# Patient Record
Sex: Female | Born: 1970
Health system: Southern US, Community
[De-identification: ages and names within clinical notes are randomized; demographics above are authoritative.]

## PROBLEM LIST (undated history)

## (undated) DIAGNOSIS — J45909 Unspecified asthma, uncomplicated: Secondary | ICD-10-CM

## (undated) DIAGNOSIS — I10 Essential (primary) hypertension: Secondary | ICD-10-CM

## (undated) DIAGNOSIS — E282 Polycystic ovarian syndrome: Secondary | ICD-10-CM

## (undated) HISTORY — PX: COSMETIC SURGERY: SHX468

## (undated) HISTORY — DX: Polycystic ovarian syndrome: E28.2

## (undated) HISTORY — DX: Unspecified asthma, uncomplicated: J45.909

## (undated) HISTORY — PX: CYSTECTOMY: SUR359

---

## 2000-04-29 ENCOUNTER — Emergency Department (HOSPITAL_COMMUNITY): Admission: EM | Admit: 2000-04-29 | Discharge: 2000-04-29 | Payer: Self-pay | Admitting: Emergency Medicine

## 2002-03-08 ENCOUNTER — Other Ambulatory Visit: Admission: RE | Admit: 2002-03-08 | Discharge: 2002-03-08 | Payer: Self-pay | Admitting: *Deleted

## 2003-07-13 ENCOUNTER — Other Ambulatory Visit: Admission: RE | Admit: 2003-07-13 | Discharge: 2003-07-13 | Payer: Self-pay | Admitting: Obstetrics and Gynecology

## 2003-08-11 ENCOUNTER — Encounter: Admission: RE | Admit: 2003-08-11 | Discharge: 2003-11-09 | Payer: Self-pay | Admitting: Obstetrics and Gynecology

## 2003-12-04 ENCOUNTER — Inpatient Hospital Stay (HOSPITAL_COMMUNITY): Admission: AD | Admit: 2003-12-04 | Discharge: 2003-12-05 | Payer: Self-pay | Admitting: *Deleted

## 2003-12-12 ENCOUNTER — Encounter: Admission: RE | Admit: 2003-12-12 | Discharge: 2003-12-12 | Payer: Self-pay | Admitting: Obstetrics and Gynecology

## 2004-01-10 ENCOUNTER — Inpatient Hospital Stay (HOSPITAL_COMMUNITY): Admission: AD | Admit: 2004-01-10 | Discharge: 2004-01-10 | Payer: Self-pay | Admitting: Obstetrics and Gynecology

## 2004-01-11 ENCOUNTER — Inpatient Hospital Stay (HOSPITAL_COMMUNITY): Admission: AD | Admit: 2004-01-11 | Discharge: 2004-01-11 | Payer: Self-pay | Admitting: Obstetrics and Gynecology

## 2004-01-16 ENCOUNTER — Encounter (INDEPENDENT_AMBULATORY_CARE_PROVIDER_SITE_OTHER): Payer: Self-pay | Admitting: Specialist

## 2004-01-16 ENCOUNTER — Ambulatory Visit (HOSPITAL_BASED_OUTPATIENT_CLINIC_OR_DEPARTMENT_OTHER): Admission: RE | Admit: 2004-01-16 | Discharge: 2004-01-16 | Payer: Self-pay | Admitting: Plastic Surgery

## 2004-01-16 ENCOUNTER — Ambulatory Visit (HOSPITAL_COMMUNITY): Admission: RE | Admit: 2004-01-16 | Discharge: 2004-01-16 | Payer: Self-pay | Admitting: Plastic Surgery

## 2004-01-25 ENCOUNTER — Inpatient Hospital Stay (HOSPITAL_COMMUNITY): Admission: RE | Admit: 2004-01-25 | Discharge: 2004-01-28 | Payer: Self-pay | Admitting: Obstetrics and Gynecology

## 2004-01-25 ENCOUNTER — Encounter (INDEPENDENT_AMBULATORY_CARE_PROVIDER_SITE_OTHER): Payer: Self-pay | Admitting: *Deleted

## 2004-01-29 ENCOUNTER — Encounter: Admission: RE | Admit: 2004-01-29 | Discharge: 2004-02-28 | Payer: Self-pay | Admitting: Obstetrics and Gynecology

## 2004-02-29 ENCOUNTER — Encounter: Admission: RE | Admit: 2004-02-29 | Discharge: 2004-03-30 | Payer: Self-pay | Admitting: Obstetrics and Gynecology

## 2004-04-30 ENCOUNTER — Encounter: Admission: RE | Admit: 2004-04-30 | Discharge: 2004-05-30 | Payer: Self-pay | Admitting: Obstetrics and Gynecology

## 2006-01-20 ENCOUNTER — Ambulatory Visit: Payer: Self-pay | Admitting: Family Medicine

## 2006-01-27 ENCOUNTER — Ambulatory Visit: Payer: Self-pay | Admitting: Family Medicine

## 2006-04-14 ENCOUNTER — Ambulatory Visit: Payer: Self-pay | Admitting: Family Medicine

## 2006-04-14 LAB — CONVERTED CEMR LAB
ALT: 13 units/L (ref 0–40)
AST: 15 units/L (ref 0–37)
Albumin: 3.6 g/dL (ref 3.5–5.2)
Alkaline Phosphatase: 70 units/L (ref 39–117)
BUN: 10 mg/dL (ref 6–23)
Basophils Absolute: 0 10*3/uL (ref 0.0–0.1)
Basophils Relative: 0.4 % (ref 0.0–1.0)
CO2: 28 meq/L (ref 19–32)
Calcium: 8.7 mg/dL (ref 8.4–10.5)
Chloride: 105 meq/L (ref 96–112)
Chol/HDL Ratio, serum: 5.9
Cholesterol: 191 mg/dL (ref 0–200)
Creatinine, Ser: 0.9 mg/dL (ref 0.4–1.2)
Eosinophil percent: 4.6 % (ref 0.0–5.0)
GFR calc non Af Amer: 75 mL/min
Glomerular Filtration Rate, Af Am: 91 mL/min/{1.73_m2}
Glucose, Bld: 89 mg/dL (ref 70–99)
HCT: 40.2 % (ref 36.0–46.0)
HDL: 32.4 mg/dL — ABNORMAL LOW (ref >39.0)
Hemoglobin: 13.7 g/dL (ref 12.0–15.0)
LDL Cholesterol: 124 mg/dL — ABNORMAL HIGH (ref 0–99)
Lymphocytes Relative: 22.4 % (ref 12.0–46.0)
MCHC: 34.1 g/dL (ref 30.0–36.0)
MCV: 89 fL (ref 78.0–100.0)
Monocytes Absolute: 0.4 10*3/uL (ref 0.2–0.7)
Monocytes Relative: 4.8 % (ref 3.0–11.0)
Neutro Abs: 5.3 10*3/uL (ref 1.4–7.7)
Neutrophils Relative %: 67.8 % (ref 43.0–77.0)
Platelets: 289 10*3/uL (ref 150–400)
Potassium: 4.2 meq/L (ref 3.5–5.1)
RBC: 4.52 M/uL (ref 3.87–5.11)
RDW: 12.2 % (ref 11.5–14.6)
Sodium: 140 meq/L (ref 135–145)
TSH: 1.42 microintl units/mL (ref 0.35–5.50)
Total Bilirubin: 0.8 mg/dL (ref 0.3–1.2)
Total Protein: 7.1 g/dL (ref 6.0–8.3)
Triglyceride fasting, serum: 174 mg/dL — ABNORMAL HIGH (ref 0–149)
VLDL: 35 mg/dL (ref 0–40)
WBC: 7.8 10*3/uL (ref 4.5–10.5)

## 2007-04-06 ENCOUNTER — Encounter: Payer: Self-pay | Admitting: Family Medicine

## 2007-06-03 ENCOUNTER — Telehealth (INDEPENDENT_AMBULATORY_CARE_PROVIDER_SITE_OTHER): Payer: Self-pay | Admitting: *Deleted

## 2007-06-04 ENCOUNTER — Ambulatory Visit: Payer: Self-pay | Admitting: Family Medicine

## 2007-06-04 LAB — CONVERTED CEMR LAB: Rapid Strep: POSITIVE

## 2007-09-22 ENCOUNTER — Encounter: Payer: Self-pay | Admitting: Family Medicine

## 2008-01-21 ENCOUNTER — Ambulatory Visit: Payer: Self-pay | Admitting: Internal Medicine

## 2008-02-04 ENCOUNTER — Ambulatory Visit: Payer: Self-pay | Admitting: Family Medicine

## 2008-05-04 ENCOUNTER — Ambulatory Visit: Payer: Self-pay | Admitting: Family Medicine

## 2008-05-04 DIAGNOSIS — J209 Acute bronchitis, unspecified: Secondary | ICD-10-CM | POA: Insufficient documentation

## 2008-05-04 DIAGNOSIS — R059 Cough, unspecified: Secondary | ICD-10-CM | POA: Insufficient documentation

## 2008-05-04 DIAGNOSIS — R05 Cough: Secondary | ICD-10-CM

## 2008-05-06 ENCOUNTER — Emergency Department (HOSPITAL_COMMUNITY): Admission: EM | Admit: 2008-05-06 | Discharge: 2008-05-06 | Payer: Self-pay | Admitting: Emergency Medicine

## 2008-12-28 ENCOUNTER — Ambulatory Visit: Payer: Self-pay | Admitting: Family Medicine

## 2008-12-28 DIAGNOSIS — G4700 Insomnia, unspecified: Secondary | ICD-10-CM | POA: Insufficient documentation

## 2008-12-28 DIAGNOSIS — E282 Polycystic ovarian syndrome: Secondary | ICD-10-CM | POA: Insufficient documentation

## 2008-12-29 ENCOUNTER — Encounter (INDEPENDENT_AMBULATORY_CARE_PROVIDER_SITE_OTHER): Payer: Self-pay | Admitting: *Deleted

## 2009-02-07 ENCOUNTER — Ambulatory Visit: Payer: Self-pay | Admitting: Family Medicine

## 2009-02-07 ENCOUNTER — Telehealth (INDEPENDENT_AMBULATORY_CARE_PROVIDER_SITE_OTHER): Payer: Self-pay | Admitting: *Deleted

## 2009-02-07 DIAGNOSIS — K5289 Other specified noninfective gastroenteritis and colitis: Secondary | ICD-10-CM | POA: Insufficient documentation

## 2009-02-07 LAB — CONVERTED CEMR LAB
BUN: 16 mg/dL (ref 6–23)
Basophils Absolute: 0 10*3/uL (ref 0.0–0.1)
Basophils Relative: 0 % (ref 0.0–3.0)
Bilirubin Urine: NEGATIVE
Blood in Urine, dipstick: NEGATIVE
CO2: 30 meq/L (ref 19–32)
Calcium: 9.2 mg/dL (ref 8.4–10.5)
Chloride: 106 meq/L (ref 96–112)
Creatinine, Ser: 0.9 mg/dL (ref 0.4–1.2)
Eosinophils Absolute: 0.1 10*3/uL (ref 0.0–0.7)
Eosinophils Relative: 1 % (ref 0.0–5.0)
GFR calc non Af Amer: 73.69 mL/min (ref >60)
Glucose, Bld: 82 mg/dL (ref 70–99)
Glucose, Urine, Semiquant: NEGATIVE
HCT: 40 % (ref 36.0–46.0)
Hemoglobin: 13.8 g/dL (ref 12.0–15.0)
Ketones, urine, test strip: NEGATIVE
Lymphocytes Relative: 33 % (ref 12.0–46.0)
Lymphs Abs: 2.4 10*3/uL (ref 0.7–4.0)
MCHC: 34.5 g/dL (ref 30.0–36.0)
MCV: 91.7 fL (ref 78.0–100.0)
Monocytes Absolute: 0.4 10*3/uL (ref 0.1–1.0)
Monocytes Relative: 5.2 % (ref 3.0–12.0)
Neutro Abs: 4.3 10*3/uL (ref 1.4–7.7)
Neutrophils Relative %: 60.8 % (ref 43.0–77.0)
Nitrite: NEGATIVE
Platelets: 221 10*3/uL (ref 150.0–400.0)
Potassium: 4 meq/L (ref 3.5–5.1)
Protein, U semiquant: NEGATIVE
RBC: 4.36 M/uL (ref 3.87–5.11)
RDW: 11.7 % (ref 11.5–14.6)
Sodium: 140 meq/L (ref 135–145)
Specific Gravity, Urine: >=1.03
Urobilinogen, UA: 0.2
WBC Urine, dipstick: NEGATIVE
WBC: 7.2 10*3/uL (ref 4.5–10.5)
pH: 5

## 2009-07-24 ENCOUNTER — Ambulatory Visit: Payer: Self-pay | Admitting: Family Medicine

## 2009-07-24 ENCOUNTER — Telehealth (INDEPENDENT_AMBULATORY_CARE_PROVIDER_SITE_OTHER): Payer: Self-pay | Admitting: *Deleted

## 2009-07-24 DIAGNOSIS — J019 Acute sinusitis, unspecified: Secondary | ICD-10-CM | POA: Insufficient documentation

## 2010-02-05 ENCOUNTER — Encounter: Admission: RE | Admit: 2010-02-05 | Discharge: 2010-02-05 | Payer: Self-pay | Admitting: Obstetrics and Gynecology

## 2010-02-19 ENCOUNTER — Ambulatory Visit: Payer: Self-pay | Admitting: Family Medicine

## 2010-02-19 DIAGNOSIS — H6690 Otitis media, unspecified, unspecified ear: Secondary | ICD-10-CM | POA: Insufficient documentation

## 2010-02-19 DIAGNOSIS — J309 Allergic rhinitis, unspecified: Secondary | ICD-10-CM | POA: Insufficient documentation

## 2010-02-20 ENCOUNTER — Telehealth (INDEPENDENT_AMBULATORY_CARE_PROVIDER_SITE_OTHER): Payer: Self-pay | Admitting: *Deleted

## 2010-02-27 ENCOUNTER — Ambulatory Visit: Payer: Self-pay | Admitting: Family Medicine

## 2010-02-28 ENCOUNTER — Encounter: Payer: Self-pay | Admitting: Family Medicine

## 2010-07-14 LAB — CONVERTED CEMR LAB
ALT: 12 units/L (ref 0–35)
AST: 14 units/L (ref 0–37)
Albumin: 3.8 g/dL (ref 3.5–5.2)
Alkaline Phosphatase: 47 units/L (ref 39–117)
BUN: 13 mg/dL (ref 6–23)
Basophils Absolute: 0 10*3/uL (ref 0.0–0.1)
Basophils Relative: 0.1 % (ref 0.0–3.0)
Bilirubin, Direct: 0.1 mg/dL (ref 0.0–0.3)
CO2: 29 meq/L (ref 19–32)
Calcium: 9.1 mg/dL (ref 8.4–10.5)
Chloride: 108 meq/L (ref 96–112)
Cholesterol: 169 mg/dL (ref 0–200)
Creatinine, Ser: 0.9 mg/dL (ref 0.4–1.2)
Eosinophils Absolute: 0.1 10*3/uL (ref 0.0–0.7)
Eosinophils Relative: 0.9 % (ref 0.0–5.0)
GFR calc non Af Amer: 73.73 mL/min (ref >60)
Glucose, Bld: 101 mg/dL — ABNORMAL HIGH (ref 70–99)
HCT: 41.2 % (ref 36.0–46.0)
HDL: 36.9 mg/dL — ABNORMAL LOW (ref >39.00)
Hemoglobin: 13.9 g/dL (ref 12.0–15.0)
LDL Cholesterol: 111 mg/dL — ABNORMAL HIGH (ref 0–99)
Lymphocytes Relative: 12.6 % (ref 12.0–46.0)
Lymphs Abs: 1.3 10*3/uL (ref 0.7–4.0)
MCHC: 33.8 g/dL (ref 30.0–36.0)
MCV: 91.6 fL (ref 78.0–100.0)
Monocytes Absolute: 0.5 10*3/uL (ref 0.1–1.0)
Monocytes Relative: 4.4 % (ref 3.0–12.0)
Neutro Abs: 8.5 10*3/uL — ABNORMAL HIGH (ref 1.4–7.7)
Neutrophils Relative %: 82 % — ABNORMAL HIGH (ref 43.0–77.0)
Platelets: 244 10*3/uL (ref 150.0–400.0)
Potassium: 4.6 meq/L (ref 3.5–5.1)
RBC: 4.5 M/uL (ref 3.87–5.11)
RDW: 12.4 % (ref 11.5–14.6)
Sodium: 142 meq/L (ref 135–145)
TSH: 1.17 microintl units/mL (ref 0.35–5.50)
Total Bilirubin: 0.9 mg/dL (ref 0.3–1.2)
Total CHOL/HDL Ratio: 5
Total Protein: 7.3 g/dL (ref 6.0–8.3)
Triglycerides: 104 mg/dL (ref 0.0–149.0)
VLDL: 20.8 mg/dL (ref 0.0–40.0)
WBC: 10.4 10*3/uL (ref 4.5–10.5)

## 2010-07-16 NOTE — Letter (Signed)
Summary: Results Follow up Letter  [facility]  [address] [place], [place] [address]   Phone: [phone]  Fax: [phone]    12/29/2008 MRN: [id number]  [facility] [address] [place], [place]  [address]  Dear Ms. MERTEL,  The following are the results of your recent test(s):  Test         Result    Pap Smear:        Normal _____  Not Normal _____ Comments: ______________________________________________________ Cholesterol: LDL(Bad cholesterol):         Your goal is less than:         HDL (Good cholesterol):       Your goal is more than: Comments:  ______________________________________________________ Mammogram:        Normal _____  Not Normal _____ Comments:  ___________________________________________________________________ Hemoccult:        Normal _____  Not normal _______ Comments:    _____________________________________________________________________ Other Tests: PLEASE SEE COPY OF LABS FROM 12/28/08 AND COMMENTS    We routinely do not discuss normal results over the telephone.  If you desire a copy of the results, or you have any questions about this information we can discuss them at your next office visit.   Sincerely,

## 2010-07-16 NOTE — Letter (Signed)
Summary: endocrine consultation  endocrine consultation   Imported By: Freddy Jaksch 06/22/2007 15:10:48  _____________________________________________________________________  External Attachment:    Type:   Image     Comment:   External Document

## 2010-07-16 NOTE — Progress Notes (Signed)
Summary: discuss labs  Phone Note Outgoing Call   Call placed by: Doristine Devoid,  February 07, 2009 4:06 PM Call placed to: Patient Summary of Call: normal WBC indicates no active bacterial infxn- no need for abx at this time.  sxs are likely related to viral illness.  electrolytes indicate no sign of dehydration.  all labs normal.  Follow-up for Phone Call        spoke with patient aware of labs ..................Marland KitchenDoristine Devoid  February 07, 2009 4:07 PM

## 2010-07-16 NOTE — Assessment & Plan Note (Signed)
Summary: upper resp. infection--acute only--tl   Vital Signs:  Patient Profile:   40 Years Old Female Weight:      195.4 pounds Temp:     98.2 degrees F oral Pulse rate:   88 / minute Resp:     14 per minute BP sitting:   142 / 98  (left arm) Cuff size:   large  Vitals Entered By: Shonna Chock (January 21, 2008 1:39 PM)                 Chief Complaint:  URI-COUGH(PRODUCTIVE @ TIMES). STARTED WITH COUGH, CONGESTION AND FE VER 2 WEEKS AGO, and Cough.  History of Present Illness: See BP; she denies HTN  . No Decongestants for RTI X 1 week  Cough; Rx: Advil Cold & Sinus      This is a 40 year old woman who presents with Cough.  The patient reports productive cough and wheezing, but denies non-productive cough, pleuritic chest pain, shortness of breath, exertional dyspnea, fever, hemoptysis, and malaise.  Associated symtpoms include nasal congestion and acid reflux symptoms.  The patient denies the following symptoms: cold/URI symptoms and sore throat.  The cough is worse with lying down.  Ineffective prior treatments have included OTC cough medication.  Risk factors include history of asthma and history of reflux.      Current Allergies (reviewed today): No known allergies     Risk Factors:  Tobacco use:  quit    Year quit:  AGE 40 Alcohol use:  yes    Type:  WINE 1-2 X MONTHLY Exercise:  no   Review of Systems  Eyes      Denies discharge, eye pain, and red eye.  ENT      No facial pain or purulence   Physical Exam  General:     well-nourished,in no acute distress; alert,appropriate and cooperative throughout examination Ears:     External ear exam shows no significant lesions or deformities.  Otoscopic examination reveals clear canals, tympanic membranes are intact bilaterally without bulging, retraction, inflammation or discharge. Hearing is grossly normal bilaterally. Nose:     External nasal examination shows no deformity or inflammation. Nasal mucosa are  pink and moist without lesions or exudates. Mouth:     Oral mucosa and oropharynx without lesions or exudates.  Teeth in good repair. Lungs:     Normal respiratory effort, chest expands symmetrically. Lungs: rare isolated  wheezes. Cervical Nodes:     No lymphadenopathy noted Axillary Nodes:     No palpable lymphadenopathy    Impression & Recommendations:  Problem # 1:  BRONCHITIS-ACUTE (ICD-466.0)  The following medications were removed from the medication list:    Penicillin V Potassium 500 Mg Tabs (Penicillin v potassium) .Marland Kitchen... 1 by mouth bid  Her updated medication list for this problem includes:    Azithromycin 250 Mg Tabs (Azithromycin) .Marland Kitchen... As per pack    Promethazine-codeine 6.25-10 Mg/5ml Syrp (Promethazine-codeine) .Marland Kitchen... 1 tsp q 6 hrs prn    Ventolin Hfa 108 (90 Base) Mcg/act Aers (Albuterol sulfate) .Marland Kitchen... 1-2 puffs q 4-6 hrs prn   Complete Medication List: 1)  Metformin Hcl 500 Mg Tabs (Metformin hcl) .Marland Kitchen.. 1 by mouth two times a day (morning) 2)  Azithromycin 250 Mg Tabs (Azithromycin) .... As per pack 3)  Promethazine-codeine 6.25-10 Mg/7ml Syrp (Promethazine-codeine) .Marland Kitchen.. 1 tsp q 6 hrs prn 4)  Ventolin Hfa 108 (90 Base) Mcg/act Aers (Albuterol sulfate) .Marland Kitchen.. 1-2 puffs q 4-6 hrs prn   Patient  Instructions: 1)  Drink as much fluid as you can tolerate for the next few days.Neti pot once daily as needed. Consider [employer] low carb diet.   Prescriptions: VENTOLIN HFA 108 (90 BASE) MCG/ACT  AERS (ALBUTEROL SULFATE) 1-2 puffs q 4-6 hrs prn  #1 x 2   Entered and Authorized by:   Marga Melnick MD   Signed by:   Marga Melnick MD on 01/21/2008   Method used:   Print then Give to Patient   RxID:   [id number] PROMETHAZINE-CODEINE 6.25-10 MG/5ML  SYRP (PROMETHAZINE-CODEINE) 1 tsp q 6 hrs prn  #120cc x 0   Entered and Authorized by:   Marga Melnick MD   Signed by:   Marga Melnick MD on 01/21/2008   Method used:   Electronically sent to ...        [facility] #[id number]*       [address]       [place], [place]  [address]       Ph: [phone]       Fax: [phone]   RxID:   [id number] AZITHROMYCIN 250 MG  TABS (AZITHROMYCIN) as per pack  #1 pack x 0   Entered and Authorized by:   Marga Melnick MD   Signed by:   Marga Melnick MD on 01/21/2008   Method used:   Electronically sent to ...       [facility] #[id number]*       [address]       [place], [place]  [address]       Ph: [phone]       Fax: [phone]   RxID:   [id number]  ]

## 2010-07-16 NOTE — Progress Notes (Signed)
  Phone Note From Pharmacy   Caller: [facility] #[id number]* Summary of Call: Augmentin not covered by insurance per dr Laury Axon change to Ceftin 500 mg 1 by mouth two times a day for 10 days.     New/Updated Medications: CEFTIN 500 MG TABS (CEFUROXIME AXETIL) 1 by mouth two times a day for 10 days. Prescriptions: CEFTIN 500 MG TABS (CEFUROXIME AXETIL) 1 by mouth two times a day for 10 days.  #20 x 0   Entered by:   Army Fossa CMA   Authorized by:   Loreen Freud DO   Signed by:   Army Fossa CMA on 07/24/2009   Method used:   Telephoned to ...       [facility] #[id number]* (retail)       [address]       [place], [place]  [address]       Ph: [phone]       Fax: [phone]   RxID:   [id number]

## 2010-07-16 NOTE — Assessment & Plan Note (Signed)
Summary: swine flu--acute only--pt already had h1n1 shot--tl   Vital Signs:  Patient Profile:   40 Years Old Female Weight:      199.13 pounds Temp:     98.1 degrees F oral BP sitting:   130 / 70  Vitals Entered By: Kandice Hams (May 04, 2008 1:09 PM)                 PCP:  Laury Axon  Chief Complaint:  pt said had flu sx fever, cough, diarrhea, vomiting, all sx gone, now c/o hard to take a deep breath in Note* pt says she has a symbicort but never took, afraid to use, and Cough.  History of Present Illness:  Cough      This is a 40 year old woman who presents with Cough.  The symptoms began duration > 3 days ago.  Pt here c/o cough and congestion since friday.  Pt had fever 102 Sat and Sun and N/VD on Saturday- Tuesday and now pt still congested but other symptoms are gone.  The patient reports productive cough, shortness of breath, and fever.  The patient denies the following symptoms: cold/URI symptoms, sore throat, nasal congestion, chronic rhinitis, weight loss, acid reflux symptoms, and peripheral edema.      Current Allergies: No known allergies      Review of Systems      See HPI   Physical Exam  General:     Well-developed,well-nourished,in no acute distress; alert,appropriate and cooperative throughout examination Ears:     External ear exam shows no significant lesions or deformities.  Otoscopic examination reveals clear canals, tympanic membranes are intact bilaterally without bulging, retraction, inflammation or discharge. Hearing is grossly normal bilaterally. Nose:     External nasal examination shows no deformity or inflammation. Nasal mucosa are pink and moist without lesions or exudates. Mouth:     Oral mucosa and oropharynx without lesions or exudates.  Teeth in good repair. Neck:     No deformities, masses, or tenderness noted. Lungs:     R decreased breath sounds and L decreased breath sounds.  improved after neb tx. Heart:     normal rate,  regular rhythm, and no murmur.   Cervical Nodes:     No lymphadenopathy noted Psych:     Cognition and judgment appear intact. Alert and cooperative with normal attention span and concentration. No apparent delusions, illusions, hallucinations    Impression & Recommendations:  Problem # 1:  BRONCHITIS, ACUTE WITH BRONCHOSPASM (ICD-466.0) Assessment: Improved resolving--call or rto if symptoms worsen The following medications were removed from the medication list:    Promethazine-codeine 6.25-10 Mg/75ml Syrp (Promethazine-codeine) .Marland Kitchen... 1 tsp q 6 hrs prn    Ventolin Hfa 108 (90 Base) Mcg/act Aers (Albuterol sulfate) .Marland Kitchen... 1-2 puffs q 4-6 hrs prn  Her updated medication list for this problem includes:    Proventil Hfa 108 (90 Base) Mcg/act Aers (Albuterol sulfate) .Marland Kitchen... 2 puffs qid as needed    Symbicort 160-4.5 Mcg/act Aero (Budesonide-formoterol fumarate) .Marland Kitchen... 2 puffs two times a day  Orders: Nebulizer Tx (53976)   Complete Medication List: 1)  Metformin Hcl 500 Mg Tabs (Metformin hcl) .Marland Kitchen.. 1 by mouth two times a day (morning) 2)  Proventil Hfa 108 (90 Base) Mcg/act Aers (Albuterol sulfate) .... 2 puffs qid as needed 3)  Symbicort 160-4.5 Mcg/act Aero (Budesonide-formoterol fumarate) .... 2 puffs two times a day    Prescriptions: SYMBICORT 160-4.5 MCG/ACT AERO (BUDESONIDE-FORMOTEROL FUMARATE) 2 puffs two times a day  #1  x 0   Entered and Authorized by:   Loreen Freud DO   Signed by:   Loreen Freud DO on 05/04/2008   Method used:   Historical   RxID:   [id number] PROVENTIL HFA 108 (90 BASE) MCG/ACT AERS (ALBUTEROL SULFATE) 2 puffs qid as needed  #1 x 1   Entered and Authorized by:   Loreen Freud DO   Signed by:   Loreen Freud DO on 05/04/2008   Method used:   Electronically to        [facility] #[id number]* (retail)       [address]       [place], [place]  [address]       Ph: [phone]       Fax: [phone]   RxID:    [id number]  ]

## 2010-07-16 NOTE — Letter (Signed)
Summary: Endocrine C  Endocrine C   Imported By: Freddy Jaksch 10/27/2007 11:56:46  _____________________________________________________________________  External Attachment:    Type:   Image     Comment:   External Document

## 2010-07-16 NOTE — Progress Notes (Signed)
Summary: ear pain worse  Phone Note Call from Patient Call back at Work Phone [phone] Call back at [phone]   Caller: Patient Summary of Call: patient was seen yesterday [phone] for ear pain - she said it has gotten worse - Initial call taken by: Okey Regal [place],  February 20, 2010 1:50 PM  Follow-up for Phone Call        spoke w/ patient informed to give medication more time to work and continue recommendations........Marland KitchenDoristine Devoid CMA  February 20, 2010 2:03 PM

## 2010-07-16 NOTE — Assessment & Plan Note (Signed)
Summary: NAUSEA,VOMITING,DIARRHEA,LOWER BACK PAIN x1WEEK/RH.....   Vital Signs:  Patient profile:   40 year old female Weight:      150 pounds Temp:     98.1 degrees F oral BP sitting:   114 / 76  (left arm)  Vitals Entered By: Doristine Devoid (February 07, 2009 1:05 PM) CC: vomiting x1 some diarrhea and lower back pain    History of Present Illness: 40 yo woman here today vomiting, diarrhea.  sxs started 8 days ago.  first few days were 'vomiting and exhaustion' but did vomit again yesterday.  2-3 days ago started w/ low back pain, now radiating to the flanks.  pain is bilateral, L>R, no blood in urine, no frequency, urgency, hesitancy.  diarrhea started 3 days ago- watery, green.  no vomiting today, still w/ nausea.  1 watery stool today.  no fevers.  + sick contact.  recent travels.  reports improvement in sxs since last week but not resolved.  recent international travel.  Allergies (verified): No Known Drug Allergies  Review of Systems      See HPI  Physical Exam  General:  Well-developed,well-nourished,in no acute distress; alert,appropriate and cooperative throughout examination Lungs:  Normal respiratory effort, chest expands symmetrically. Lungs are clear to auscultation, no crackles or wheezes. Heart:  normal rate, regular rhythm, and no murmur.   Abdomen:  soft, diffuse discomfort w/ palpation, + BS.  no rebound, guarding.  no CVA tenderness.   Impression & Recommendations:  Problem # 1:  GASTROENTERITIS (ICD-558.9) Assessment New pt's sxs consistent w/ gastroenteritis but given recent international travel and egg recall will check stool cx for infxn.  will check CBC to determine viral vs bacterial infxn.  if bacterial infxn will presume diverticulitis and start Cipro and Flagyl.  pt would prefer to avoid CT scan at this time.  given improvement in sxs virus is likely b/c diverticulitis would worsen w/out tx.  will follow closely. Orders: Venipuncture ([address]) TLB-CBC  Platelet - w/Differential (85025-CBCD) TLB-BMP (Basic Metabolic Panel-BMET) (80048-METABOL) T-Culture, Stool ([id number]) UA Dipstick w/o Micro (manual) (60454)  Complete Medication List: 1)  Glumetza 1000 Mg Xr24h-tab (Metformin hcl) .... Take one tablet two times a day 2)  Zolpidem Tartrate 5 Mg Tabs (Zolpidem tartrate) .Marland Kitchen.. 1-2 by mouth at night as needed 3)  Promethazine Hcl 25 Mg Tabs (Promethazine hcl) .Marland Kitchen.. 1 tab by mouth q6 as needed for nausea  Patient Instructions: 1)  We'll notify you of your lab results 2)  Try and drink plenty of fluids to avoid dehydration 3)  Take the Promethazine as needed for nausea 4)  Use Immodium for the diarrhea- after collecting your stool sample 5)  Call with any questions or concerns 6)  If your pain worsens, you are unable to hold anything down, or develop high fevers- please call or go to the ER 7)  HANG IN THERE!! Prescriptions: PROMETHAZINE HCL 25 MG  TABS (PROMETHAZINE HCL) 1 tab by mouth Q6 as needed for nausea  #20 x 0   Entered and Authorized by:   Neena Rhymes MD   Signed by:   Neena Rhymes MD on 02/07/2009   Method used:   Electronically to        [facility] #[id number]* (retail)       [address]       [place], [place]  [address]       Ph: [phone]  Fax: [phone]   RxID:   [id number]   Laboratory Results   Urine Tests    Routine Urinalysis   Glucose: negative   (Normal Range: Negative) Bilirubin: negative   (Normal Range: Negative) Ketone: negative   (Normal Range: Negative) Spec. Gravity: >=1.030   (Normal Range: 1.003-1.035) Blood: negative   (Normal Range: Negative) pH: 5.0   (Normal Range: 5.0-8.0) Protein: negative   (Normal Range: Negative) Urobilinogen: 0.2   (Normal Range: 0-1) Nitrite: negative   (Normal Range: Negative) Leukocyte Esterace: negative   (Normal Range: Negative)

## 2010-07-16 NOTE — Consult Note (Signed)
Summary: [facility]  [facility]   Imported By: Lanelle Bal 03/08/2010 13:50:22  _____________________________________________________________________  External Attachment:    Type:   Image     Comment:   External Document

## 2010-07-16 NOTE — Assessment & Plan Note (Signed)
Summary: acute only/sore throat strep?/alr   Vital Signs:  Patient Profile:   40 Years Old Female Weight:      197.50 pounds Temp:     97.8 degrees F oral Pulse rate:   74 / minute Resp:     16 per minute BP sitting:   130 / 90  (right arm)  Pt. in pain?   no  Vitals Entered By: Ardyth Man (June 04, 2007 11:13 AM)                  Chief Complaint:  ?Strep throat and URI symptoms.  History of Present Illness:  URI Symptoms      This is a 40 year old woman who presents with URI symptoms.  The patient reports sore throat, but denies nasal congestion, clear nasal discharge, purulent nasal discharge, dry cough, productive cough, earache, and sick contacts.  Associated symptoms include fever.  The patient denies stiff neck, dyspnea, wheezing, rash, vomiting, diarrhea, and use of an antipyretic.  Symptoms started Wednesday. Son diagnosed with strep.        Physical Exam  General:     Well-developed,well-nourished,in no acute distress; alert,appropriate and cooperative throughout examination Ears:     External ear exam shows no significant lesions or deformities.  Otoscopic examination reveals clear canals, tympanic membranes are intact bilaterally without bulging, retraction, inflammation or discharge. Hearing is grossly normal bilaterally. Nose:     External nasal examination shows no deformity or inflammation. Nasal mucosa are pink and moist without lesions or exudates. Mouth:     pharyngeal erythema.   Neck:     No deformities, masses, or tenderness noted. Lungs:     Normal respiratory effort, chest expands symmetrically. Lungs are clear to auscultation, no crackles or wheezes. Heart:     Normal rate and regular rhythm. S1 and S2 normal without gallop, murmur, click, rub or other extra sounds. Cervical Nodes:     tender anterior cervical lymph nodes.    Impression & Recommendations:  Problem # 1:  PHARYNGITIS-ACUTE (ICD-462)  Her updated medication  list for this problem includes:    Penicillin V Potassium 500 Mg Tabs (Penicillin v potassium) .Marland Kitchen... 1 by mouth bid Instructed to complete antibiotics and call if not improved or if symptoms worsen, she should f/u Symptomatic care review  Complete Medication List: 1)  Penicillin V Potassium 500 Mg Tabs (Penicillin v potassium) .Marland Kitchen.. 1 by mouth bid     Prescriptions: PENICILLIN V POTASSIUM 500 MG  TABS (PENICILLIN V POTASSIUM) 1 by mouth bid  #20 x 0   Entered and Authorized by:   Leanne Chang MD   Signed by:   Leanne Chang MD on 06/04/2007   Method used:   Electronically sent to ...       [facility] #[id number]*       [address]       [place], [place]  [address]       Ph: [phone]       Fax: [phone]   RxID:   [id number]  ] Laboratory Results  Date/Time Received: ...................................................................Ardyth Man  June 04, 2007 11:20 AM  Date/Time Reported: ...................................................................Ardyth Man  June 04, 2007 11:20 AM   Other Tests  Rapid Strep: positive    Appended Document: acute only/sore throat strep?/alr    Clinical Lists Changes  Observations: Added new observation of NKA: T (06/04/2007 11:48)

## 2010-07-16 NOTE — Assessment & Plan Note (Signed)
Summary: ear not better/cbs   Vital Signs:  Patient profile:   40 year old female Height:      67 inches Weight:      170 pounds Temp:     98.4 degrees F oral Pulse rate:   72 / minute BP sitting:   140 / 90  (left arm)  Vitals Entered By: Jeremy Johann CMA (February 27, 2010 1:51 PM) CC: pain, pressure and ringing in both ears   History of Present Illness: 40 yo woman here today for bilateral ear pain.  pt started on amox on 9/6.  pt still has 2 days remaining of abx.  taking motrin continuously.  using nasal spray, sudafed w/out relief.  still having pain, pressure- 'the ringing is terrible'.    Current Medications (verified): 1)  Glumetza 1000 Mg Xr24h-Tab (Metformin Hcl) .... Take One Tablet Two Times A Day 2)  Zolpidem Tartrate 5 Mg  Tabs (Zolpidem Tartrate) .Marland Kitchen.. 1-2 By Mouth At Night As Needed 3)  Flonase 50 Mcg/act Susp (Fluticasone Propionate) .... 2 Sprays Each Nostril Once Daily 4)  Amoxicillin 500 Mg Tabs (Amoxicillin) .Marland Kitchen.. 1 Tab By Mouth Two Times A Day X10 Days  Allergies (verified): No Known Drug Allergies  Past History:  Past Medical History: PCOS serous otitis  Review of Systems      See HPI  Physical Exam  General:  Well-developed,well-nourished,in no acute distress; alert,appropriate and cooperative throughout examination Head:  Normocephalic and atraumatic without obvious abnormalities. No apparent alopecia or balding.  no TTP over sinuses Eyes:  no injxn or inflammation Ears:  + serous fluid behind TMs bilaterally Nose:  + turbinate edema Mouth:  + PND   Impression & Recommendations:  Problem # 1:  OTITIS MEDIA (ICD-382.9) Assessment Unchanged pt w/ continued serous OM.  given upcoming flight and lack of improvement on Amox, sudafed, nasal steroid will refer to ENT.  Pt expresses understanding and is in agreement w/ this plan. Her updated medication list for this problem includes:    Amoxicillin 500 Mg Tabs (Amoxicillin) .Marland Kitchen... 1 tab by mouth  two times a day x10 days  Orders: ENT Referral (ENT)  Complete Medication List: 1)  Glumetza 1000 Mg Xr24h-tab (Metformin hcl) .... Take one tablet two times a day 2)  Zolpidem Tartrate 5 Mg Tabs (Zolpidem tartrate) .Marland Kitchen.. 1-2 by mouth at night as needed 3)  Flonase 50 Mcg/act Susp (Fluticasone propionate) .... 2 sprays each nostril once daily 4)  Amoxicillin 500 Mg Tabs (Amoxicillin) .Marland Kitchen.. 1 tab by mouth two times a day x10 days  Patient Instructions: 1)  Follow up as needed 2)  Someone will call you with your ENT appt 3)  Continue the nasal spray, sudafed, and finish the amox 4)  HANG IN THERE!!!

## 2010-07-16 NOTE — Assessment & Plan Note (Signed)
Summary: ?cold//vgj   Vital Signs:  Patient Profile:   40 Years Old Female Weight:      198.50 pounds Temp:     98.3 degrees F oral Pulse rate:   82 / minute Resp:     14 per minute BP sitting:   120 / 80  (right arm)  Pt. in pain?   no  Vitals Entered By: Ardyth Man (February 04, 2008 2:04 PM)                  Chief Complaint:  cough, clear sputum, and URI symptoms.  History of Present Illness:  URI Symptoms      This is a 40 year old woman who presents with URI symptoms.  The patient reports nasal congestion and productive cough.  The patient denies fever, low-grade fever (<100.5 degrees), fever of 100.5-103 degrees, fever of 103.1-104 degrees, fever to >104 degrees, stiff neck, dyspnea, wheezing, rash, vomiting, diarrhea, use of an antipyretic, and response to antipyretic.  The patient denies itchy watery eyes, itchy throat, sneezing, seasonal symptoms, response to antihistamine, headache, muscle aches, and severe fatigue.  The patient denies the following risk factors for Strep sinusitis: unilateral facial pain, unilateral nasal discharge, poor response to decongestant, double sickening, tooth pain, Strep exposure, tender adenopathy, and absence of cough.      Current Allergies: No known allergies     Risk Factors: Tobacco use:  quit    Year quit:  AGE 46 Alcohol use:  yes    Type:  WINE 1-2 X MONTHLY Exercise:  no   Review of Systems      See HPI   Physical Exam  General:     Well-developed,well-nourished,in no acute distress; alert,appropriate and cooperative throughout examination Neck:     No deformities, masses, or tenderness noted. Lungs:     Normal respiratory effort, chest expands symmetrically. Lungs are clear to auscultation, no crackles or wheezes. Heart:     normal rate, regular rhythm, and no murmur.   Extremities:     No clubbing, cyanosis, edema, or deformity noted with normal full range of motion of all joints.   Psych:     Oriented  X3, memory intact for recent and remote, and normally interactive.      Impression & Recommendations:  Problem # 1:  BRONCHITIS-ACUTE (ICD-466.0) Rto if no better The following medications were removed from the medication list:    Azithromycin 250 Mg Tabs (Azithromycin) .Marland Kitchen... As per pack  Her updated medication list for this problem includes:    Promethazine-codeine 6.25-10 Mg/61ml Syrp (Promethazine-codeine) .Marland Kitchen... 1 tsp q 6 hrs prn    Ventolin Hfa 108 (90 Base) Mcg/act Aers (Albuterol sulfate) .Marland Kitchen... 1-2 puffs q 4-6 hrs prn    Avelox 400 Mg Tabs (Moxifloxacin hcl) .Marland Kitchen... 1 by mouth once daily for 5 days  Orders: T-2 View CXR (71020TC) Xopenex 1.25mg  (Z6109) Admin of Therapeutic Inj  intramuscular or subcutaneous ([address])  Take antibiotics and other medications as directed. Encouraged to push clear liquids, get enough rest, and take acetaminophen as needed. To be seen in 5-7 days if no improvement, sooner if worse.   Complete Medication List: 1)  Metformin Hcl 500 Mg Tabs (Metformin hcl) .Marland Kitchen.. 1 by mouth two times a day (morning) 2)  Promethazine-codeine 6.25-10 Mg/17ml Syrp (Promethazine-codeine) .Marland Kitchen.. 1 tsp q 6 hrs prn 3)  Ventolin Hfa 108 (90 Base) Mcg/act Aers (Albuterol sulfate) .Marland Kitchen.. 1-2 puffs q 4-6 hrs prn 4)  Prednisone 10 Mg Tabs (Prednisone) .Marland KitchenMarland KitchenMarland Kitchen  3 by mouth once daily for 3 days then 2 by mouth once daily for 3 days then 1 by mouth once daily for 3 days 5)  Avelox 400 Mg Tabs (Moxifloxacin hcl) .Marland Kitchen.. 1 by mouth once daily for 5 days    Prescriptions: AVELOX 400 MG TABS (MOXIFLOXACIN HCL) 1 by mouth once daily for 5 days  #5 x 0   Entered and Authorized by:   Loreen Freud DO   Signed by:   Loreen Freud DO on 02/04/2008   Method used:   Electronically to        [facility] [phone]* (retail)       [address]       [place], [place]  [address]       Ph: [phone]       Fax: [phone]   RxID:   [id number] PREDNISONE  10 MG TABS (PREDNISONE) 3 by mouth once daily for 3 days then 2 by mouth once daily for 3 days then 1 by mouth once daily for 3 days  #18 x 0   Entered and Authorized by:   Loreen Freud DO   Signed by:   Loreen Freud DO on 02/04/2008   Method used:   Electronically to        [facility] [phone]* (retail)       [address]       [place], [place]  [address]       Ph: [phone]       Fax: [phone]   RxID:   [id number]  ]  Medication Administration  Medication # 1:    Medication: Xopenex 1.25mg     Diagnosis: BRONCHITIS-ACUTE (ICD-466.0)    Route: inhaled    Exp Date: 02/14/2008    Lot #: [id number]    Mfr: sepracor    Patient tolerated medication without complications    Given by: Ardyth Man (February 04, 2008 3:03 PM)  Orders Added: 1)  T-2 View CXR [71020TC] 2)  Xopenex 1.25mg  [N2355] 3)  Admin of Therapeutic Inj  intramuscular or subcutaneous [96[address]] 4)  Est. Patient Level IV [73220]

## 2010-07-16 NOTE — Assessment & Plan Note (Signed)
Summary: earache/cbs   Vital Signs:  Patient profile:   40 year old female Height:      67 inches (170.18 cm) Weight:      173 pounds (78.64 kg) BMI:     27.19 Temp:     98.2 degrees F (36.78 degrees C) oral BP sitting:   110 / 76  (left arm) Cuff size:   regular  Vitals Entered By: Lucious Groves CMA (February 19, 2010 11:43 AM) CC: C/O earache x2.5 weeks./kb Is Patient Diabetic? No Pain Assessment Patient in pain? yes     Location: jaw Intensity: 7 Type: aching Onset of pain  2.5 weeks Comments Patient notes that it began on the left side and now is on the right also. Patient notes that it is a great deal of "pressure". Patient denies bleeding from the ear, fever, and HA./kb   History of Present Illness: 40 yo woman here today for L ear pain.  sxs started 2.5 weeks ago and is now spreading to R ear.  has been traveling overseas.  no fever.  'i woke up 1 morning and it felt like it was clogged w/ severe ringing'.  denies sinus congestion, nasal drainage, cough, sore throat.  pain located along L upper jaw- similar to sinus pain but w/out other accompanying sxs.  flies frequently.  Current Medications (verified): 1)  Glumetza 1000 Mg Xr24h-Tab (Metformin Hcl) .... Take One Tablet Two Times A Day 2)  Zolpidem Tartrate 5 Mg  Tabs (Zolpidem Tartrate) .Marland Kitchen.. 1-2 By Mouth At Night As Needed 3)  Amlodipine Besylate 10 Mg Tabs (Amlodipine Besylate) .Marland Kitchen.. 1 By Mouth Daily 4)  Ceftin 500 Mg Tabs (Cefuroxime Axetil) .Marland Kitchen.. 1 By Mouth Two Times A Day For 10 Days. 5)  Cheratussin Ac 100-10 Mg/10ml Syrp (Guaifenesin-Codeine) .Marland Kitchen.. 1-2 Tsp By Mouth At Bedtime As Needed 6)  Flonase 50 Mcg/act Susp (Fluticasone Propionate) .... 2 Sprays Each Nostril Once Daily 7)  Amoxicillin 500 Mg Tabs (Amoxicillin) .Marland Kitchen.. 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 No apparent alopecia or balding.  no TTP over sinuses Eyes:  no injxn or inflammation Ears:  R TM retracted L TM w/ opaque fluid behind it Nose:  + turbinate edema Mouth:  + PND Neck:  No deformities, masses, or tenderness noted. Lungs:  Normal respiratory effort, chest expands symmetrically. Lungs are clear to auscultation, no crackles or wheezes. Heart:  normal rate and no murmur.     Impression & Recommendations:  Problem # 1:  OTITIS MEDIA (ICD-382.9) Assessment New pt w/ opaque fluid behind L TM.  start amox. The following medications were removed from the medication list:    Ceftin 500 Mg Tabs (Cefuroxime axetil) .Marland Kitchen... 1 by mouth two times a day for 10 days. Her updated medication list for this problem includes:    Amoxicillin 500 Mg Tabs (Amoxicillin) .Marland Kitchen... 1 tab by mouth two times a day x10 days  Problem # 2:  ALLERGIC RHINITIS (ICD-477.9) Assessment: New resume Flonase.  sudafed temporarily for sxs relief. Her updated medication list for this problem includes:  Flonase 50 Mcg/act Susp (Fluticasone propionate) .Marland Kitchen... 2 sprays each nostril once daily  Complete Medication List: 1)  Glumetza 1000 Mg Xr24h-tab (Metformin hcl) .... Take one tablet two times a day 2)  Zolpidem Tartrate 5 Mg Tabs (Zolpidem tartrate) .Marland Kitchen.. 1-2 by mouth at night as needed 3)  Amlodipine Besylate 10 Mg Tabs (Amlodipine besylate) .Marland Kitchen.. 1 by mouth daily 4)  Cheratussin Ac 100-10 Mg/62ml Syrp (Guaifenesin-codeine) .Marland Kitchen.. 1-2 tsp by mouth at bedtime as needed 5)  Flonase 50 Mcg/act Susp (Fluticasone propionate) .... 2 sprays each nostril once daily 6)  Amoxicillin 500 Mg Tabs  (Amoxicillin) .Marland Kitchen.. 1 tab by mouth two times a day x10 days  Patient Instructions: 1)  Schedule your complete physical at your convenience- do not eat before this appt 2)  Take the Amoxicillin as directed- take w/ food to avoid upset stomach 3)  Sudafed for the nasal congestion and fluid in the ears 4)  Ibuprofen for pain 5)  Restart the nasal spray for allergy component 6)  Hang in there!!! Prescriptions: FLONASE 50 MCG/ACT SUSP (FLUTICASONE PROPIONATE) 2 sprays each nostril once daily  #1 x 3   Entered and Authorized by:   Neena Rhymes MD   Signed by:   Neena Rhymes MD on 02/19/2010   Method used:   Electronically to        [facility] [phone]* (retail)       [address]       [place], [place]  [address]       Ph: [phone]       Fax: [phone]   RxID:   [id number] AMOXICILLIN 500 MG TABS (AMOXICILLIN) 1 tab by mouth two times a day x10 days  #20 x 0   Entered and Authorized by:   Neena Rhymes MD   Signed by:   Neena Rhymes MD on 02/19/2010   Method used:   Electronically to        [facility] #[id number]* (retail)       [address]       [place], [place]  [address]       Ph: [phone]       Fax: [phone]   RxID:   [id number]

## 2010-07-16 NOTE — Assessment & Plan Note (Signed)
Summary: sinus infection/kdc   Vital Signs:  Patient profile:   40 year old female Height:      67 inches Weight:      160.13 pounds BMI:     25.17 Temp:     98.2 degrees F oral Pulse rate:   72 / minute Pulse rhythm:   regular BP sitting:   122 / 80  (left arm) Cuff size:   large  Vitals Entered By: Army Fossa CMA (July 24, 2009 3:13 PM) CC: Pt c/o chest and nasal congestion, teeth pressure, ear pressure x 4 weeks. , URI symptoms   History of Present Illness:       This is a 40 year old woman who presents with URI symptoms.  The symptoms began 4 weeks ago.  The patient complains of nasal congestion, purulent nasal discharge, and earache, but denies clear nasal discharge, sore throat, dry cough, productive cough, and sick contacts.  The patient denies fever, low-grade fever (<100.5 degrees), fever of 100.5-103 degrees, fever of 103.1-104 degrees, fever to >104 degrees, stiff neck, dyspnea, wheezing, rash, vomiting, diarrhea, use of an antipyretic, and response to antipyretic.  The patient also reports headache.  The patient denies the following risk factors for Strep sinusitis: unilateral facial pain, unilateral nasal discharge, poor response to decongestant, double sickening, tooth pain, Strep exposure, tender adenopathy, and absence of cough.  + otc with no relief --+ b/l sinus pressure.  Allergies (verified): No Known Drug Allergies  Past History:  Past medical, surgical, family and social histories (including risk factors) reviewed for relevance to current acute and chronic problems.  Past Medical History: Reviewed history from 12/28/2008 and no changes required. PCOS  Past Surgical History: Reviewed history from 12/28/2008 and no changes required. Csection R ganglion cyst removed  Family History: Reviewed history from 12/28/2008 and no changes required. Colon Cancer- maternal great grandmother (dx'd 69-60s) Breast Cancer- none CAD- both sides of family DM-  MGM MGF- prostate CA, kidney CA, splenic CA, lung CA (alive at 68)  Social History: Reviewed history from 12/28/2008 and no changes required. 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  Otoscopic examination reveals clear canals, tympanic membranes are intact bilaterally without bulging, retraction, inflammation or discharge. Hearing is grossly normal bilaterally. Nose:  L maxillary sinus tenderness and R maxillary sinus tenderness.   Mouth:  Oral mucosa and oropharynx without lesions or exudates.  Teeth in good repair. Neck:  No deformities, masses, or tenderness noted. Lungs:  Normal respiratory effort, chest expands symmetrically. Lungs are clear to auscultation, no crackles or wheezes. Heart:  normal rate and no murmur.   Psych:  Cognition and judgment appear intact. Alert and cooperative with normal attention span and concentration. No apparent delusions, illusions, hallucinations   Impression & Recommendations:  Problem # 1:  SINUSITIS - ACUTE-NOS (ICD-461.9)  Her updated medication list for this problem includes:    Augmentin 125-31.25 Mg/38ml Susr (Amoxicillin-pot clavulanate) .Marland Kitchen... 1 by mouth two times a day    Cheratussin Ac 100-10 Mg/63ml Syrp (Guaifenesin-codeine) .Marland Kitchen... 1-2 tsp by mouth at bedtime as needed    Flonase 50 Mcg/act Susp (Fluticasone propionate) .Marland Kitchen... 2 sprays each nostril once daily  Instructed on treatment. Call if symptoms persist or worsen.   Complete Medication List:  1)  Glumetza 1000 Mg Xr24h-tab (Metformin hcl) .... Take one tablet two times a day 2)  Zolpidem Tartrate 5 Mg Tabs (Zolpidem tartrate) .Marland Kitchen.. 1-2 by mouth at night as needed 3)  Amlodipine Besylate 10 Mg Tabs (Amlodipine  besylate) .Marland Kitchen.. 1 by mouth daily 4)  Augmentin 125-31.25 Mg/38ml Susr (Amoxicillin-pot clavulanate) .Marland Kitchen.. 1 by mouth two times a day 5)  Cheratussin Ac 100-10 Mg/65ml Syrp (Guaifenesin-codeine) .Marland Kitchen.. 1-2 tsp by mouth at bedtime as needed 6)  Flonase 50 Mcg/act Susp (Fluticasone propionate) .... 2 sprays each nostril once daily Prescriptions: ZOLPIDEM TARTRATE 5 MG  TABS (ZOLPIDEM TARTRATE) 1-2 by mouth at night as needed  #30 x 0   Entered and Authorized by:   Loreen Freud DO   Signed by:   Loreen Freud DO on 07/24/2009   Method used:   Print then Give to Patient   RxID:   [id number] FLONASE 50 MCG/ACT SUSP (FLUTICASONE PROPIONATE) 2 sprays each nostril once daily  #1 x 1   Entered and Authorized by:   Loreen Freud DO   Signed by:   Loreen Freud DO on 07/24/2009   Method used:   Electronically to        [facility] #[id number]* (retail)       [address]       [place], [place]  [address]       Ph: [phone]       Fax: [phone]   RxID:   [id number] CHERATUSSIN AC 100-10 MG/5ML SYRP (GUAIFENESIN-CODEINE) 1-2 tsp by mouth at bedtime as needed  #6 oz x 0   Entered and Authorized by:   Loreen Freud DO   Signed by:   Loreen Freud DO on 07/24/2009   Method used:   Print then Give to Patient   RxID:   [id number] AUGMENTIN 125-31.25 MG/5ML SUSR (AMOXICILLIN-POT CLAVULANATE) 1 by mouth two times a day  #20 x 0   Entered and Authorized by:   Loreen Freud DO   Signed by:   Loreen Freud DO on 07/24/2009   Method used:   Electronically to        [facility] #[id number]* (retail)       [address]       [place], [place]  [address]       Ph: [phone]       Fax: [phone]   RxID:   [id number]

## 2010-07-16 NOTE — Assessment & Plan Note (Signed)
Summary: cpx/pt wants ambien/alr   Vital Signs:  Patient profile:   40 year old female Weight:      156 pounds Pulse rate:   68 / minute BP sitting:   112 / 70  (left arm)  Vitals Entered By: Doristine Devoid (December 28, 2008 8:39 AM) CC: cpx    History of Present Illness: 40 yo woman here today for CPE.  GYN- Cousins.  Endo- Balan (PCOS).  no questions or concerns about health.  reports she travels internationally for job and has to be in [place] 7-10 days/month.  wants Ambien for overnight flights.  has never taken Ambien before- 'all my colleagues, that's how they do it'.  Preventive Screening-Counseling & Management  Alcohol-Tobacco     Alcohol drinks/day: <1     Smoking Status: quit > 6 months  Caffeine-Diet-Exercise     Does Patient Exercise: yes     Type of exercise: personal trainer     Times/week: <3      Drug Use:  never.    Current Medications (verified): 1)  Glumetza 1000 Mg Xr24h-Tab (Metformin Hcl) .... Take One Tablet Two Times A Day  Allergies (verified): No Known Drug Allergies  Past History:  Past Medical History: PCOS  Past Surgical History: Csection R ganglion cyst removed  Past History:  Care Management: Gynecology:Dr. Couzens Endocrinology:Dr. 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  The patient denies anorexia, fever, weight loss, weight gain, vision loss, decreased hearing, hoarseness, syncope, dyspnea on exertion, peripheral edema, prolonged cough, headaches, abdominal pain, melena, hematochezia, severe indigestion/heartburn, hematuria, suspicious  skin lesions, depression, abnormal bleeding, enlarged lymph nodes, and breast masses.         intermittant sharp CP- resolves w/in 10 seconds, occurs once every 3 months.  occuring less frequently w/ recent wt loss.  Physical Exam  General:  Well-developed,well-nourished,in no acute distress; alert,appropriate and cooperative throughout examination Head:  Normocephalic and atraumatic without obvious abnormalities. No apparent alopecia or balding. Eyes:  No corneal or conjunctival inflammation noted. EOMI. Perrla. Funduscopic exam benign, without hemorrhages, exudates or papilledema. Vision grossly normal. Ears:  External ear exam shows no significant lesions or deformities.  Otoscopic examination reveals clear canals, tympanic membranes are intact bilaterally without bulging, retraction, inflammation or discharge. Hearing is grossly normal bilaterally. Nose:  External nasal examination shows no deformity or inflammation. Nasal mucosa are pink and moist without lesions or exudates. Mouth:  Oral mucosa and oropharynx without lesions or exudates.  Teeth in good repair. Neck:  No deformities, masses, or tenderness noted. Breasts:  deferred to gyn Lungs:  Normal respiratory effort, chest expands symmetrically. Lungs are clear to auscultation, no crackles or wheezes. Heart:  normal rate, regular rhythm, and no murmur.   Abdomen:  Bowel sounds positive,abdomen soft and non-tender without masses, organomegaly or hernias noted. Genitalia:  deferred to gyn Msk:  No deformity or scoliosis noted of thoracic or lumbar spine.   Pulses:  +2 carotid, radial, DP Extremities:  No clubbing, cyanosis, edema, or deformity noted with normal full range of motion of all joints.   Neurologic:  No cranial nerve deficits noted. Station and gait are normal. Plantar reflexes are down-going bilaterally. DTRs are symmetrical throughout. Sensory, motor and coordinative functions appear intact. Skin:  Intact without suspicious  lesions or rashes Cervical Nodes:  No lymphadenopathy noted Psych:  Cognition and judgment appear intact. Alert and cooperative with normal attention span and concentration. No apparent delusions, illusions, hallucinations   Impression & Recommendations:  Problem # 1:  PHYSICAL EXAMINATION (ICD-V70.0) Assessment New pt's PE WNL.  check labs as below.  anticipatory guidance provided.  EKG obtained- some nonspecific Twave changes.  given family hx of CAD will fax to cards for review.  pt asymptomatic today and EKG done as baseline. Orders: Venipuncture ([address]) TLB-Lipid Panel (80061-LIPID) TLB-BMP (Basic Metabolic Panel-BMET) (80048-METABOL) TLB-CBC Platelet - w/Differential (85025-CBCD) TLB-Hepatic/Liver Function Pnl (80076-HEPATIC) TLB-TSH (Thyroid Stimulating Hormone) (84443-TSH) EKG w/ Interpretation (93000)  Problem # 2:  INSOMNIA-SLEEP DISORDER-UNSPEC (ICD-780.52) Assessment: New ambien given for overnight flights Her updated medication list for this problem includes:    Zolpidem Tartrate 5 Mg Tabs (Zolpidem tartrate) .Marland Kitchen... 1-2 by mouth at night as needed  Complete Medication List: 1)  Glumetza 1000 Mg Xr24h-tab (Metformin hcl) .... Take one tablet two times a day 2)  Zolpidem Tartrate 5 Mg Tabs (Zolpidem tartrate) .Marland Kitchen.. 1-2 by mouth at night as needed  Patient Instructions: 1)  Please schedule a follow-up appointment in 1 year or as needed. 2)  We'll notify you of your lab results 3)  Take the Ambien as needed- if 1 pill isn't enough you can take 2 4)  Keep up the good work on diet and exercise 5)  Call with any questions or concerns 6)  Enjoy your trips!  Prescriptions: ZOLPIDEM TARTRATE 5 MG  TABS (ZOLPIDEM TARTRATE) 1-2 by mouth at night as needed  #30 x 0   Entered and Authorized by:   Neena Rhymes MD   Signed by:   Neena Rhymes MD on 12/28/2008   Method used:   Print then Give to Patient   RxID:   [id number]

## 2010-07-16 NOTE — Progress Notes (Signed)
Summary: strip throat ?  Phone Note Call from Patient Call back at [phone]   Caller: Patient Reason for Call: Acute Illness Summary of Call: dr. Laury Axon pt's son was diag. with strip throat on tuesday. she said that her throat is bothering her. every time she swollows it hurts and no white spots. she is not have a fever. she would concern an urgent care. she would like  a call from the nurse to see if she can be worked in. she is aware that we are short two doctors tomorrow.  Initial call taken by: Charolette Child,  June 03, 2007 4:02 PM  Follow-up for Phone Call        s/w pt who c/o sore throat son was dx earlier this week with strep started last night ov sched in am...................................................................Marland KitchenKandice Hams  June 03, 2007 4:42 PM  Follow-up by: Kandice Hams,  June 03, 2007 4:42 PM

## 2010-09-04 ENCOUNTER — Other Ambulatory Visit: Payer: Self-pay | Admitting: Family Medicine

## 2010-09-04 NOTE — Telephone Encounter (Signed)
Last seen 02/27/10 and filled 07/24/09-- please advise. KP

## 2010-09-05 NOTE — Telephone Encounter (Signed)
Change to 30

## 2010-09-06 MED ORDER — ZOLPIDEM TARTRATE 5 MG PO TABS: 5.0000 mg | ORAL_TABLET | Freq: Every evening | ORAL | Status: AC | PRN

## 2010-09-06 NOTE — Telephone Encounter (Signed)
Refill x1 

## 2010-09-06 NOTE — Telephone Encounter (Signed)
Spoke with patient--requested [facility] on [address]

## 2010-11-01 NOTE — Op Note (Signed)
NAME:  Jennifer Charles, Jennifer Charles                         ACCOUNT NO.:  [id number]   MEDICAL RECORD NO.:  [id number]                   PATIENT TYPE:  INP   LOCATION:  9119                                 FACILITY:  [facility]   PHYSICIAN:  Maxie Better, M.D.            DATE OF BIRTH:  08/21/1970   DATE OF PROCEDURE:  01/25/2004  DATE OF DISCHARGE:                                 OPERATIVE REPORT   PREOPERATIVE DIAGNOSIS:  Spontaneous rupture of membranes, intrauterine  gestation at 38-1/7 weeks, class A1 gestational diabetes, fetal macrosomia.   POSTOPERATIVE DIAGNOSIS:  Fetal macrosomia, spontaneous rupture of  membranes, class A1 gestational diabetes, intrauterine gestation at 38-1/7  weeks.   OPERATION PERFORMED:  Primary cesarean section, Kerr hysterotomy.   SURGEON:  Maxie Better, M.D.   ANESTHESIA:  Spinal.   INDICATIONS FOR PROCEDURE:  The patient is a 40 year old gravida 1, para 0  female at 38-1/7 weeks with class A1 gestational diabetes with diagnosis of  fetal macrosomia scheduled for cesarean section on January 31, 2004, who  presented with spontaneous rupture of membranes. The risks and benefits of  the procedure was explained including the patient's increased risk for  shoulder dystocia due to fetal macrosomia and associated class A1  gestational diabetes.  The patient was transferred to the operating room.   DESCRIPTION OF PROCEDURE:  Under adequate spinal anesthesia, the patient was  placed in supine position with a left lateral tilt.  She was sterilely  prepped and draped in the usual fashion.  An indwelling Foley catheter was  sterilely placed.  0.25% Marcaine was injected along the planned  Pfannenstiel skin incision.  Pfannenstiel skin incision was then made and  carried down to the rectus fascia. The rectus fascia was incised in the  midline and extended bilaterally.  Rectus muscle was then bluntly dissected  off __________ superior and inferior fashion.  The  parietal peritoneum was  subsequently opened bluntly and extended.  The vesicouterine peritoneum was  then developed.  The bladder was bluntly dissected off the lower uterine  segment and displaced inferiorly using a bladder retractor.  A curvilinear  low transverse incision was then made and extended bilaterally using bandage  scissors.  There was then noted a live infant.  It was initially attempted  to be delivered in the usual fashion; however, due to the large head, vacuum  application was then performed with subsequent delivery of a live female who  ultimately weighed 8 pounds 15 ounces with a cord around the neck times one  which was reducible.  He was bulb suctioned on the abdomen, cord was  clamped, cut, the baby was transferred to the waiting pediatricians with  Apgars of 9 and 9 at one and five minutes.  The placenta was manually  removed.  The uterine cavity was then cleaned of debris.  The uterine  incision was then closed in two layers, the first layer with 0 Monocryl  running locked stitch, second layer was imbricated using 0 Monocryl suture.  Small bleeding was then cauterized.  Normal tubes and ovaries bilaterally  were noted.  The abdomen was then copiously irrigated and then suctioned of  debris.  Pericolic gutters were cleaned of debris.  Reinspection of the  incision sites showed good hemostasis at which time the parietal peritoneum  and the vesicouterine peritoneum were now closed.  The undersurface of the  rectus fascia was inspected.  Small bleeders cauterized.  The rectus fascia  was closed with 0 Vicryl times two.  The subcutaneous area was irrigated,  small bleeders cauterized and the skin approximated using Ethicon staples.   SPECIMENS:  Placenta sent to pathology.   FLUIDS REPLACED:  3500 mL crystalloid.   URINE OUTPUT:  150 mL clear yellow urine.   ESTIMATED BLOOD LOSS:  800 mL.   INSTRUMENT COUNT:  Sponge and instrument counts times two were correct.    COMPLICATIONS:  None.   The patient tolerated the procedure well.                                               Maxie Better, M.D.    [place]y/MEDQ  D:  01/25/2004  T:  01/26/2004  Job:  [id number]

## 2010-11-01 NOTE — Discharge Summary (Signed)
NAME:  Jennifer Charles, Jennifer Charles                         ACCOUNT NO.:  [id number]   MEDICAL RECORD NO.:  [id number]                   PATIENT TYPE:  INP   LOCATION:  9119                                 FACILITY:  [facility]   PHYSICIAN:  Maxie Better, M.D.            DATE OF BIRTH:  April 30, 1971   DATE OF ADMISSION:  01/25/2004  DATE OF DISCHARGE:  01/28/2004                                 DISCHARGE SUMMARY   ADMISSION DIAGNOSES:  1. Class A1 gestational diabetes.  2. Fetal macrosomia.  3. Intrauterine gestation at 38-1/7 weeks.  4. Spontaneous rupture of membranes.   DISCHARGE DIAGNOSES:  1. Intrauterine gestation at 38-1/7 weeks, delivered.  2. Class A1 gestational diabetes.  3. Fetal macrosomia.  4. Postoperative anemia.  5. Spontaneous rupture of membranes.   PROCEDURE:  Primary cesarean section, Kerr hysterotomy.   INDICATIONS:  A 40 year old gravida 1, para 0 female, at 38-1/7 weeks'  gestation, with class A1 gestational diabetes and fetal macrosomia, who  presented with spontaneous rupture of membranes.   HOSPITAL COURSE:  The patient was admitted to [facility], confirmed  rupture of membranes.  She was taken to the operating room, where she  underwent a primary cesarean section via a low transverse uterine incision.  The procedure resulted in the delivery of a live female infant weighing 8  pounds 15 ounces, Apgars of 9 and 9.  The placenta was manually removed  intact, sent to pathology.  The result is pending.  Normal tubes and ovaries  were noted at that time.  The EBL was about 800.  The patient had an  uncomplicated postoperative course.  Her CBC on postop day #1 showed a  hemoglobin of 8.9, hematocrit of 26.3, platelet count of 174,000.  By postop  day #3 the patient had had two small bowel movements.  She had tolerated a  regular diet, was afebrile, and was deemed well to be discharged.  Her  incision showed no erythema, induration, or exudate.  She had staples in  place.  The uterus was firm, two fingerbreadths below the umbilicus,  nontender.  Pad with moderate lochia.  Extremity was 1-2+ edema.   DISPOSITION:  Home.   CONDITION:  Stable.   DISCHARGE MEDICATIONS:  1. Tylox #30, one to two tablets every three to four hours p.r.n. pain.  2. Nu-Iron 150 mg one p.o. b.i.d.  3. Motrin 800 mg one p.o. q.6-8h. p.r.n. pain.  4. Prenatal vitamins one p.o. daily.   DISCHARGE INSTRUCTIONS:  Per postpartum booklet given and reviewed  instructions with the patient.   FOLLOW-UP APPOINTMENT:  At four weeks for postop care.  Staples will be  removed prior to discharge from the hospital.  Maxie Better, M.D.    [place]/MEDQ  D:  01/28/2004  T:  01/29/2004  Job:  [id number]

## 2010-11-01 NOTE — Op Note (Signed)
NAME:  Jennifer Charles, Jennifer Charles                         ACCOUNT NO.:  [id number]   MEDICAL RECORD NO.:  [id number]                   PATIENT TYPE:  AMB   LOCATION:  DSC                                  FACILITY:  [facility]   PHYSICIAN:  Consuello Bossier., M.D.         DATE OF BIRTH:  21-Apr-1971   DATE OF PROCEDURE:  01/16/2004  DATE OF DISCHARGE:                                 OPERATIVE REPORT   PREOPERATIVE DIAGNOSIS:  Recently-biopsied dysplastic nevus, left upper  back.   POSTOPERATIVE DIAGNOSIS:  Recently-biopsied dysplastic nevus, left upper  back.   OPERATION:  Wide local excision of a 1.4 cm lesion with a 3.0 cm closure.   SURGEON:  Pleas Patricia, M.D.   ANESTHESIA:  Xylocaine 2% with epinephrine 1:100,000.   FINDINGS:  The patient had a recent biopsy of dysplastic nevus of her left  upper back area, for which the above surgical procedure was carried out.  She is about [redacted] weeks pregnant, and received clearance to have this done at  this point.   DESCRIPTION OF PROCEDURE:  The patient was brought to the operating room and  marked off for the planned elliptical excision as much as possible in the  wrinkles is planned, with the lesion itself, in terms of the scarring from a  round area measuring 1.0 cm and approximately 2.0 mm distance on either  side, for a total diameter of 1.4 cm.  This area was marked off as an  elliptical excision, and an excisional biopsy was performed, after local  anesthesia with Xylocaine 2% with epinephrine 1:100,000, and a Betadine  prep.  The skin edges were then approximated with interrupted subcutaneous  #4 Monocryl, followed by a running subcuticular #4 Vicryl.  Steri-Strips  were then applied.  The patient tolerated the procedure well.  She was able  to be discharged from the operating room to the recovery room, to be  followed by me as an outpatient.                                               Consuello Bossier., M.D.    Tery Sanfilippo  D:   01/16/2004  T:  01/16/2004  Job:  [id number]

## 2011-05-15 ENCOUNTER — Other Ambulatory Visit: Payer: Self-pay | Admitting: Obstetrics and Gynecology

## 2011-05-15 DIAGNOSIS — Z1231 Encounter for screening mammogram for malignant neoplasm of breast: Secondary | ICD-10-CM

## 2011-06-20 ENCOUNTER — Ambulatory Visit
Admission: RE | Admit: 2011-06-20 | Discharge: 2011-06-20 | Disposition: A | Discharge status: Home / Self Care | Payer: BC Managed Care – PPO | Source: Ambulatory Visit | Attending: Obstetrics and Gynecology | Admitting: Obstetrics and Gynecology

## 2011-06-20 DIAGNOSIS — Z1231 Encounter for screening mammogram for malignant neoplasm of breast: Secondary | ICD-10-CM

## 2012-01-06 ENCOUNTER — Encounter: Payer: Self-pay | Admitting: Family Medicine

## 2012-01-06 ENCOUNTER — Ambulatory Visit (INDEPENDENT_AMBULATORY_CARE_PROVIDER_SITE_OTHER): Payer: BC Managed Care – PPO | Admitting: Family Medicine

## 2012-01-06 VITALS — BP 132/86 | HR 69 | Temp 98.1°F | Ht 67.0 in | Wt 187.4 lb

## 2012-01-06 DIAGNOSIS — K589 Irritable bowel syndrome without diarrhea: Secondary | ICD-10-CM

## 2012-01-06 DIAGNOSIS — N390 Urinary tract infection, site not specified: Secondary | ICD-10-CM

## 2012-01-06 DIAGNOSIS — K219 Gastro-esophageal reflux disease without esophagitis: Secondary | ICD-10-CM

## 2012-01-06 DIAGNOSIS — I10 Essential (primary) hypertension: Secondary | ICD-10-CM

## 2012-01-06 LAB — LIPID PANEL
Cholesterol: 181 mg/dL (ref 0–200)
HDL: 43.7 mg/dL (ref >39.00)
LDL Cholesterol: 120 mg/dL — ABNORMAL HIGH (ref 0–99)
Total CHOL/HDL Ratio: 4
Triglycerides: 85 mg/dL (ref 0.0–149.0)
VLDL: 17 mg/dL (ref 0.0–40.0)

## 2012-01-06 LAB — HEPATIC FUNCTION PANEL
ALT: 17 U/L (ref 0–35)
AST: 14 U/L (ref 0–37)
Albumin: 4.1 g/dL (ref 3.5–5.2)
Alkaline Phosphatase: 63 U/L (ref 39–117)
Bilirubin, Direct: 0 mg/dL (ref 0.0–0.3)
Total Bilirubin: 0.6 mg/dL (ref 0.3–1.2)
Total Protein: 7.1 g/dL (ref 6.0–8.3)

## 2012-01-06 LAB — POCT URINALYSIS DIPSTICK
Bilirubin, UA: NEGATIVE
Blood, UA: NEGATIVE
Glucose, UA: NEGATIVE
Ketones, UA: NEGATIVE
Nitrite, UA: NEGATIVE
Protein, UA: NEGATIVE
Spec Grav, UA: <=1.005
Urobilinogen, UA: 0.2
pH, UA: 8.5

## 2012-01-06 LAB — MICROALBUMIN / CREATININE URINE RATIO
Creatinine,U: 190.9 mg/dL
Microalb Creat Ratio: 0.4 mg/g (ref 0.0–30.0)
Microalb, Ur: 0.8 mg/dL (ref 0.0–1.9)

## 2012-01-06 LAB — TSH: TSH: 1.6 u[IU]/mL (ref 0.35–5.50)

## 2012-01-06 LAB — CBC WITH DIFFERENTIAL/PLATELET
Basophils Absolute: 0 10*3/uL (ref 0.0–0.1)
Basophils Relative: 0.5 % (ref 0.0–3.0)
Eosinophils Absolute: 0.2 10*3/uL (ref 0.0–0.7)
Eosinophils Relative: 3.3 % (ref 0.0–5.0)
HCT: 39.9 % (ref 36.0–46.0)
Hemoglobin: 13.4 g/dL (ref 12.0–15.0)
Lymphocytes Relative: 24.6 % (ref 12.0–46.0)
Lymphs Abs: 1.5 10*3/uL (ref 0.7–4.0)
MCHC: 33.5 g/dL (ref 30.0–36.0)
MCV: 91.4 fl (ref 78.0–100.0)
Monocytes Absolute: 0.4 10*3/uL (ref 0.1–1.0)
Monocytes Relative: 6.2 % (ref 3.0–12.0)
Neutro Abs: 4.1 10*3/uL (ref 1.4–7.7)
Neutrophils Relative %: 65.4 % (ref 43.0–77.0)
Platelets: 230 10*3/uL (ref 150.0–400.0)
RBC: 4.37 Mil/uL (ref 3.87–5.11)
RDW: 12.8 % (ref 11.5–14.6)
WBC: 6.3 10*3/uL (ref 4.5–10.5)

## 2012-01-06 LAB — BASIC METABOLIC PANEL WITH GFR
BUN: 10 mg/dL (ref 6–23)
CO2: 27 meq/L (ref 19–32)
Calcium: 8.8 mg/dL (ref 8.4–10.5)
Chloride: 105 meq/L (ref 96–112)
Creatinine, Ser: 0.8 mg/dL (ref 0.4–1.2)
GFR: 89.66 mL/min
Glucose, Bld: 88 mg/dL (ref 70–99)
Potassium: 4.7 meq/L (ref 3.5–5.1)
Sodium: 137 meq/L (ref 135–145)

## 2012-01-06 LAB — H. PYLORI ANTIBODY, IGG: H Pylori IgG: NEGATIVE

## 2012-01-06 MED ORDER — HYOSCYAMINE SULFATE 0.125 MG SL SUBL: SUBLINGUAL_TABLET | SUBLINGUAL | Status: DC

## 2012-01-06 MED ORDER — AMLODIPINE BESYLATE 10 MG PO TABS: 10.0000 mg | ORAL_TABLET | Freq: Every day | ORAL | Status: DC

## 2012-01-06 NOTE — Assessment & Plan Note (Signed)
Erosive esophagitis---  [facility]                          About 1999

## 2012-01-06 NOTE — Addendum Note (Signed)
Addended by: Silvio Pate D on: 01/06/2012 11:28 AM   Modules accepted: Orders

## 2012-01-06 NOTE — Progress Notes (Signed)
  Subjective:     Jennifer Charles is a 41 y.o. female who presents for evaluation of abdominal cramping, abdominal pain, loose stools and constipation. Onset of symptoms was about 1 year ago--worsening over the last six months. Current symptoms: crampy abdominal pain: moderate: location: in the lower abdomen, abdominal bloating: moderate, watery stools occurring 6 times per day and constipation alternating with loose stools. Patient denies anorexia, arthralagias, belching, chills, dysuria, fever, flatus, frequency, headache, hematochezia, hematuria, melena, myalgias, sweats and vomiting. Symptoms have gradually worsened. Previous visits for this problem: none. Evaluation to date has been none. Treatment to date has been none.  The following portions of the patient's history were reviewed and updated as appropriate: allergies, current medications, past family history, past medical history, past social history, past surgical history and problem list.  Review of Systems Pertinent items are noted in HPI.   Objective:    BP 132/86  Pulse 69  Temp 98.1 F (36.7 C) (Oral)  Ht 5\' 7"  (1.702 m)  Wt 187 lb 6.4 oz (85.004 kg)  BMI 29.35 kg/m2  SpO2 97% General appearance: alert, cooperative, appears stated age and no distress Throat: lips, mucosa, and tongue normal; teeth and gums normal Lungs: clear to auscultation bilaterally Heart: S1, S2 normal Abdomen: normal findings: soft and abnormal findings:  mild tenderness in the entire abdomen Lymph nodes: Cervical, supraclavicular, and axillary nodes normal.   Assessment:    Irritable bowel syndrome which has gradually worsened.   GERD Plan:    Discussed the management and usual course of IBS. Transport planner distributed. Discussed dietary advice. GI referral. levsin sl  con't prevacid Check labs

## 2012-01-06 NOTE — Patient Instructions (Addendum)
Diet for GERD or PUD Nutrition therapy can help ease the discomfort of gastroesophageal reflux disease (GERD) and peptic ulcer disease (PUD).  HOME CARE INSTRUCTIONS   Eat your meals slowly, in a relaxed setting.   Eat 5 to 6 small meals per day.   If a food causes distress, stop eating it for a period of time.  FOODS TO AVOID  Coffee, regular or decaffeinated.   Cola beverages, regular or low calorie.   Tea, regular or decaffeinated.   Pepper.   Cocoa.   High fat foods, including meats.   Butter, margarine, hydrogenated oil (trans fats).   Peppermint or spearmint (if you have GERD).   Fruits and vegetables if not tolerated.   Alcohol.   Nicotine (smoking or chewing). This is one of the most potent stimulants to acid production in the gastrointestinal tract.   Any food that seems to aggravate your condition.  If you have questions regarding your diet, ask your caregiver or a registered dietitian. TIPS  Lying flat may make symptoms worse. Keep the head of your bed raised 6 to 9 inches (15 to 23 cm) by using a foam wedge or blocks under the legs of the bed.   Do not lay down until 3 hours after eating a meal.   Daily physical activity may help reduce symptoms.  MAKE SURE YOU:   Understand these instructions.   Will watch your condition.   Will get help right away if you are not doing well or get worse.  Document Released: 06/02/2005 Document Revised: 05/22/2011 Document Reviewed: 04/18/2011 [facility] Patient Information 2012 [place], [place].  Irritable Bowel Syndrome Irritable Bowel Syndrome (IBS) is caused by a disturbance of normal bowel function. Other terms used are spastic colon, mucous colitis, and irritable colon. It does not require surgery, nor does it lead to cancer. There is no cure for IBS. But with proper diet, stress reduction, and medication, you will find that your problems (symptoms) will gradually disappear or improve. IBS is a common digestive  disorder. It usually appears in late adolescence or early adulthood. Women develop it twice as often as men. CAUSES  After food has been digested and absorbed in the small intestine, waste material is moved into the colon (large intestine). In the colon, water and salts are absorbed from the undigested products coming from the small intestine. The remaining residue, or fecal material, is held for elimination. Under normal circumstances, gentle, rhythmic contractions on the bowel walls push the fecal material along the colon towards the rectum. In IBS, however, these contractions are irregular and poorly coordinated. The fecal material is either retained too long, resulting in constipation, or expelled too soon, producing diarrhea. SYMPTOMS  The most common symptom of IBS is pain. It is typically in the lower left side of the belly (abdomen). But it may occur anywhere in the abdomen. It can be felt as heartburn, backache, or even as a dull pain in the arms or shoulders. The pain comes from excessive bowel-muscle spasms and from the buildup of gas and fecal material in the colon. This pain:  Can range from sharp belly (abdominal) cramps to a dull, continuous ache.   Usually worsens soon after eating.   Is typically relieved by having a bowel movement or passing gas.  Abdominal pain is usually accompanied by constipation. But it may also produce diarrhea. The diarrhea typically occurs right after a meal or upon arising in the morning. The stools are typically soft and watery. They are  often flecked with secretions (mucus). Other symptoms of IBS include:  Bloating.   Loss of appetite.   Heartburn.   Feeling sick to your stomach (nausea).   Belching   Vomiting   Gas.  IBS may also cause a number of symptoms that are unrelated to the digestive system:  Fatigue.   Headaches.   Anxiety   Shortness of breath   Difficulty in concentrating.   Dizziness.  These symptoms tend to come and  go. DIAGNOSIS  The symptoms of IBS closely mimic the symptoms of other, more serious digestive disorders. So your caregiver may wish to perform a variety of additional tests to exclude these disorders. He/she wants to be certain of learning what is wrong (diagnosis). The nature and purpose of each test will be explained to you. TREATMENT A number of medications are available to help correct bowel function and/or relieve bowel spasms and abdominal pain. Among the drugs available are:  Mild, non-irritating laxatives for severe constipation and to help restore normal bowel habits.   Specific anti-diarrheal medications to treat severe or prolonged diarrhea.   Anti-spasmodic agents to relieve intestinal cramps.   Your caregiver may also decide to treat you with a mild tranquilizer or sedative during unusually stressful periods in your life.  The important thing to remember is that if any drug is prescribed for you, make sure that you take it exactly as directed. Make sure that your caregiver knows how well it worked for you. HOME CARE INSTRUCTIONS   Avoid foods that are high in fat or oils. Some examples ZOX:WRUEA cream, butter, frankfurters, sausage, and other fatty meats.   Avoid foods that have a laxative effect, such as fruit, fruit juice, and dairy products.   Cut out carbonated drinks, chewing gum, and "gassy" foods, such as beans and cabbage. This may help relieve bloating and belching.   Bran taken with plenty of liquids may help relieve constipation.   Keep track of what foods seem to trigger your symptoms.   Avoid emotionally charged situations or circumstances that produce anxiety.   Start or continue exercising.   Get plenty of rest and sleep.  MAKE SURE YOU:   Understand these instructions.   Will watch your condition.   Will get help right away if you are not doing well or get worse.  Document Released: 06/02/2005 Document Revised: 05/22/2011 Document Reviewed:  01/21/2008 [facility] Patient Information 2012 [place], [place].

## 2012-01-11 LAB — URINE CULTURE: Colony Count: >=100000

## 2012-01-12 ENCOUNTER — Other Ambulatory Visit: Payer: Self-pay | Admitting: *Deleted

## 2012-01-12 ENCOUNTER — Encounter: Payer: Self-pay | Admitting: *Deleted

## 2012-01-12 MED ORDER — CIPROFLOXACIN HCL 250 MG PO TABS: 250.0000 mg | ORAL_TABLET | Freq: Two times a day (BID) | ORAL | Status: AC

## 2012-01-19 ENCOUNTER — Other Ambulatory Visit: Payer: Self-pay | Admitting: Gastroenterology

## 2012-02-09 ENCOUNTER — Telehealth: Payer: Self-pay | Admitting: *Deleted

## 2012-02-09 NOTE — Telephone Encounter (Signed)
Pt states that she has been having flare up of asthma and would like to get Rx sent in for inhaler. Pt notes that Dr Laury Axon had given her inhaler several years ago but she does not remember the name..Left message to call office to see what symptoms Pt was having. no history of asthma list on problem list or on med list..Please advise

## 2012-02-09 NOTE — Telephone Encounter (Signed)
That was back in 2008/09 and it was for bronchitis----asthma never diagnosed.  It she is having problems she should be seen so we can do PFTs

## 2012-02-10 NOTE — Telephone Encounter (Signed)
Please schedule this patient for Thursday at 3 pm for PFT's with Dr.Lowne. 15 mins only

## 2012-02-11 NOTE — Telephone Encounter (Signed)
Done

## 2012-02-12 ENCOUNTER — Encounter: Payer: Self-pay | Admitting: Family Medicine

## 2012-02-12 ENCOUNTER — Ambulatory Visit (INDEPENDENT_AMBULATORY_CARE_PROVIDER_SITE_OTHER): Payer: BC Managed Care – PPO | Admitting: Family Medicine

## 2012-02-12 VITALS — BP 122/76 | HR 91 | Temp 98.5°F | Wt 182.6 lb

## 2012-02-12 DIAGNOSIS — R05 Cough: Secondary | ICD-10-CM

## 2012-02-12 DIAGNOSIS — R059 Cough, unspecified: Secondary | ICD-10-CM

## 2012-02-12 DIAGNOSIS — J45909 Unspecified asthma, uncomplicated: Secondary | ICD-10-CM

## 2012-02-12 MED ORDER — ALBUTEROL SULFATE HFA 108 (90 BASE) MCG/ACT IN AERS: 2.0000 | INHALATION_SPRAY | Freq: Four times a day (QID) | RESPIRATORY_TRACT | Status: DC | PRN

## 2012-02-12 MED ORDER — BECLOMETHASONE DIPROPIONATE 40 MCG/ACT IN AERS: 2.0000 | INHALATION_SPRAY | Freq: Two times a day (BID) | RESPIRATORY_TRACT | Status: DC

## 2012-02-12 NOTE — Progress Notes (Signed)
  Subjective:    Patient ID: Jennifer BISE, female    DOB: [birth year], 41 y.o.   MRN: [id number]  HPI Pt here for PFTs-- dx with asthma in her 20's but she has not needed an inhaler in a long time.  She has had some difficulty with wheezing lately and wanted a refill on her inhaler.   Review of Systems As above    Objective:   Physical Exam  Constitutional: She appears well-developed and well-nourished.  HENT:  Right Ear: External ear normal.  Left Ear: External ear normal.  Nose: Nose normal.  Neck: Normal range of motion. Neck supple.  Cardiovascular: Normal rate and regular rhythm.   Pulmonary/Chest: Effort normal and breath sounds normal. No respiratory distress. She has no wheezes. She has no rales.  Psychiatric: She has a normal mood and affect. Her behavior is normal. Judgment and thought content normal.          Assessment & Plan:

## 2012-02-12 NOTE — Patient Instructions (Signed)
Asthma Attack Prevention HOW CAN ASTHMA BE PREVENTED? Currently, there is no way to prevent asthma from starting. However, you can take steps to control the disease and prevent its symptoms after you have been diagnosed. Learn about your asthma and how to control it. Take an active role to control your asthma by working with your caregiver to create and follow an asthma action plan. An asthma action plan guides you in taking your medicines properly, avoiding factors that make your asthma worse, tracking your level of asthma control, responding to worsening asthma, and seeking emergency care when needed. To track your asthma, keep records of your symptoms, check your peak flow number using a peak flow meter (handheld device that shows how well air moves out of your lungs), and get regular asthma checkups.  Other ways to prevent asthma attacks include:  Use medicines as your caregiver directs.   Identify and avoid things that make your asthma worse (as much as you can).   Keep track of your asthma symptoms and level of control.   Get regular checkups for your asthma.   With your caregiver, write a detailed plan for taking medicines and managing an asthma attack. Then be sure to follow your action plan. Asthma is an ongoing condition that needs regular monitoring and treatment.   Identify and avoid asthma triggers. A number of outdoor allergens and irritants (pollen, mold, cold air, air pollution) can trigger asthma attacks. Find out what causes or makes your asthma worse, and take steps to avoid those triggers (see below).   Monitor your breathing. Learn to recognize warning signs of an attack, such as slight coughing, wheezing or shortness of breath. However, your lung function may already decrease before you notice any signs or symptoms, so regularly measure and record your peak airflow with a home peak flow meter.   Identify and treat attacks early. If you act quickly, you're less likely to have  a severe attack. You will also need less medicine to control your symptoms. When your peak flow measurements decrease and alert you to an upcoming attack, take your medicine as instructed, and immediately stop any activity that may have triggered the attack. If your symptoms do not improve, get medical help.   Pay attention to increasing quick-relief inhaler use. If you find yourself relying on your quick-relief inhaler (such as albuterol), your asthma is not under control. See your caregiver about adjusting your treatment.  IDENTIFY AND CONTROL FACTORS THAT MAKE YOUR ASTHMA WORSE A number of common things can set off or make your asthma symptoms worse (asthma triggers). Keep track of your asthma symptoms for several weeks, detailing all the environmental and emotional factors that are linked with your asthma. When you have an asthma attack, go back to your asthma diary to see which factor, or combination of factors, might have contributed to it. Once you know what these factors are, you can take steps to control many of them.  Allergies: If you have allergies and asthma, it is important to take asthma prevention steps at home. Asthma attacks (worsening of asthma symptoms) can be triggered by allergies, which can cause temporary increased inflammation of your airways. Minimizing contact with the substance to which you are allergic will help prevent an asthma attack. Animal Dander:   Some people are allergic to the flakes of skin or dried saliva from animals with fur or feathers. Keep these pets out of your home.   If you can't keep a pet outdoors, keep the   pet out of your bedroom and other sleeping areas at all times, and keep the door closed.   Remove carpets and furniture covered with cloth from your home. If that is not possible, keep the pet away from fabric-covered furniture and carpets.  Dust Mites:  Many people with asthma are allergic to dust mites. Dust mites are tiny bugs that are found in  every home, in mattresses, pillows, carpets, fabric-covered furniture, bedcovers, clothes, stuffed toys, fabric, and other fabric-covered items.   Cover your mattress in a special dust-proof cover.   Cover your pillow in a special dust-proof cover, or wash the pillow each week in hot water. Water must be hotter than 130 F to kill dust mites. Cold or warm water used with detergent and bleach can also be effective.   Wash the sheets and blankets on your bed each week in hot water.   Try not to sleep or lie on cloth-covered cushions.   Call ahead when traveling and ask for a smoke-free hotel room. Bring your own bedding and pillows, in case the hotel only supplies feather pillows and down comforters, which may contain dust mites and cause asthma symptoms.   Remove carpets from your bedroom and those laid on concrete, if you can.   Keep stuffed toys out of the bed, or wash the toys weekly in hot water or cooler water with detergent and bleach.  Cockroaches:  Many people with asthma are allergic to the droppings and remains of cockroaches.   Keep food and garbage in closed containers. Never leave food out.   Use poison baits, traps, powders, gels, or paste (for example, boric acid).   If a spray is used to kill cockroaches, stay out of the room until the odor goes away.  Indoor Mold:  Fix leaky faucets, pipes, or other sources of water that have mold around them.   Clean moldy surfaces with a cleaner that has bleach in it.  Pollen and Outdoor Mold:  When pollen or mold spore counts are high, try to keep your windows closed.   Stay indoors with windows closed from late morning to afternoon, if you can. Pollen and some mold spore counts are highest at that time.   Ask your caregiver whether you need to take or increase anti-inflammatory medicine before your allergy season starts.  Irritants:   Tobacco smoke is an irritant. If you smoke, ask your caregiver how you can quit. Ask family  members to quit smoking, too. Do not allow smoking in your home or car.   If possible, do not use a wood-burning stove, kerosene heater, or fireplace. Minimize exposure to all sources of smoke, including incense, candles, fires, and fireworks.   Try to stay away from strong odors and sprays, such as perfume, talcum powder, hair spray, and paints.   Decrease humidity in your home and use an indoor air cleaning device. Reduce indoor humidity to below 60 percent. Dehumidifiers or central air conditioners can do this.   Try to have someone else vacuum for you once or twice a week, if you can. Stay out of rooms while they are being vacuumed and for a short while afterward.   If you vacuum, use a dust mask from a hardware store, a double-layered or microfilter vacuum cleaner bag, or a vacuum cleaner with a HEPA filter.   Sulfites in foods and beverages can be irritants. Do not drink beer or wine, or eat dried fruit, processed potatoes, or shrimp if they cause asthma   symptoms.   Cold air can trigger an asthma attack. Cover your nose and mouth with a scarf on cold or windy days.   Several health conditions can make asthma more difficult to manage, including runny nose, sinus infections, reflux disease, psychological stress, and sleep apnea. Your caregiver will treat these conditions, as well.   Avoid close contact with people who have a cold or the flu, since your asthma symptoms may get worse if you catch the infection from them. Wash your hands thoroughly after touching items that may have been handled by people with a respiratory infection.   Get a flu shot every year to protect against the flu virus, which often makes asthma worse for days or weeks. Also get a pneumonia shot once every five to 10 years.  Drugs:  Aspirin and other painkillers can cause asthma attacks. 10% to 20% of people with asthma have sensitivity to aspirin or a group of painkillers called non-steroidal anti-inflammatory drugs  (NSAIDS), such as ibuprofen and naproxen. These drugs are used to treat pain and reduce fevers. Asthma attacks caused by any of these medicines can be severe and even fatal. These drugs must be avoided in people who have known aspirin sensitive asthma. Products with acetaminophen are considered safe for people who have asthma. It is important that people with aspirin sensitivity read labels of all over-the-counter drugs used to treat pain, colds, coughs, and fever.   Beta blockers and ACE inhibitors are other drugs which you should discuss with your caregiver, in relation to your asthma.  ALLERGY SKIN TESTING  Ask your asthma caregiver about allergy skin testing or blood testing (RAST test) to identify the allergens to which you are sensitive. If you are found to have allergies, allergy shots (immunotherapy) for asthma may help prevent future allergies and asthma. With allergy shots, small doses of allergens (substances to which you are allergic) are injected under your skin on a regular schedule. Over a period of time, your body may become used to the allergen and less responsive with asthma symptoms. You can also take measures to minimize your exposure to those allergens. EXERCISE  If you have exercise-induced asthma, or are planning vigorous exercise, or exercise in cold, humid, or dry environments, prevent exercise-induced asthma by following your caregiver's advice regarding asthma treatment before exercising. Document Released: 05/21/2009 Document Revised: 05/22/2011 Document Reviewed: 05/21/2009 [employer] Patient Information 2012 [employer]. 

## 2012-02-12 NOTE — Assessment & Plan Note (Signed)
qvar 40 2 puffs bid proair  2 puffs bid rto prn

## 2012-03-11 ENCOUNTER — Encounter: Payer: Self-pay | Admitting: Family Medicine

## 2012-05-28 ENCOUNTER — Encounter: Payer: Self-pay | Admitting: Family Medicine

## 2012-05-28 ENCOUNTER — Ambulatory Visit (INDEPENDENT_AMBULATORY_CARE_PROVIDER_SITE_OTHER): Payer: BC Managed Care – PPO | Admitting: Family Medicine

## 2012-05-28 VITALS — BP 116/74 | HR 80 | Temp 99.0°F | Wt 185.4 lb

## 2012-05-28 DIAGNOSIS — J45909 Unspecified asthma, uncomplicated: Secondary | ICD-10-CM

## 2012-05-28 DIAGNOSIS — J40 Bronchitis, not specified as acute or chronic: Secondary | ICD-10-CM

## 2012-05-28 MED ORDER — AZITHROMYCIN 250 MG PO TABS: ORAL_TABLET | ORAL | Status: DC

## 2012-05-28 MED ORDER — ALBUTEROL SULFATE HFA 108 (90 BASE) MCG/ACT IN AERS: 2.0000 | INHALATION_SPRAY | Freq: Four times a day (QID) | RESPIRATORY_TRACT | Status: AC | PRN

## 2012-05-28 MED ORDER — BECLOMETHASONE DIPROPIONATE 40 MCG/ACT IN AERS: 2.0000 | INHALATION_SPRAY | Freq: Two times a day (BID) | RESPIRATORY_TRACT | Status: DC

## 2012-05-28 MED ORDER — ALBUTEROL SULFATE (2.5 MG/3ML) 0.083% IN NEBU
2.5000 mg | INHALATION_SOLUTION | Freq: Once | RESPIRATORY_TRACT | Status: AC
  Administered 2012-05-28: 2.5 mg via RESPIRATORY_TRACT

## 2012-05-28 NOTE — Addendum Note (Signed)
Addended by: Arnette Norris on: 05/28/2012 06:19 PM   Modules accepted: Orders

## 2012-05-28 NOTE — Progress Notes (Signed)
  Subjective:     Jennifer Charles is a 41 y.o. female here for evaluation of a cough. Onset of symptoms was 2 days ago. Symptoms have been gradually worsening since that time. The cough is productive and is aggravated by dust, exercise, infection, pollens and reclining position. Associated symptoms include: shortness of breath, sputum production and wheezing. Patient does have a history of asthma. Patient does have a history of environmental allergens. Patient has not traveled recently. Patient does not have a history of smoking. Patient has not had a previous chest x-ray. Patient has not had a PPD done.  The following portions of the patient's history were reviewed and updated as appropriate:  She  has a past medical history of PCOS (polycystic ovarian syndrome) and Asthma. She  does not have any pertinent problems on file. She  has past surgical history that includes Cosmetic surgery and Cystectomy. Her family history includes Cancer in her maternal grandfather and others; Colon cancer in her maternal aunt and maternal grandmother; Coronary artery disease in an unspecified family member; Diabetes in her maternal grandmother and mother; Heart disease (age of onset:57) in her father; Hyperlipidemia in her father; Hypertension in her father and mother; Kidney cancer in her maternal grandfather; Lung cancer in her maternal grandfather; and Prostate cancer in her maternal grandfather. She  reports that she has been passively smoking Cigarettes.  She has never used smokeless tobacco. She reports that she drinks alcohol. She reports that she does not use illicit drugs. She has a current medication list which includes the following prescription(s): albuterol, amlodipine, lorcaserin hcl, azithromycin, and beclomethasone. Current Outpatient Prescriptions on File Prior to Visit  Medication Sig Dispense Refill  . albuterol (PROAIR HFA) 108 (90 BASE) MCG/ACT inhaler Inhale 2 puffs into the lungs every 6 (six)  hours as needed for wheezing.  1 Inhaler  2  . amLODipine (NORVASC) 10 MG tablet Take 1 tablet (10 mg total) by mouth daily.  90 tablet  3  . beclomethasone (QVAR) 40 MCG/ACT inhaler Inhale 2 puffs into the lungs 2 (two) times daily.  1 Inhaler  12   She  has no known allergies..  Review of Systems Pertinent items are noted in HPI.    Objective:    Oxygen saturation 98% on room air BP 116/74  Pulse 80  Temp 99 F (37.2 C) (Oral)  Wt 185 lb 6.4 oz (84.097 kg)  SpO2 98% General appearance: alert, cooperative, appears stated age and no distress Ears: normal TM's and external ear canals both ears Nose: Nares normal. Septum midline. Mucosa normal. No drainage or sinus tenderness. Throat: lips, mucosa, and tongue normal; teeth and gums normal Neck: no adenopathy, supple, symmetrical, trachea midline and thyroid not enlarged, symmetric, no tenderness/mass/nodules Lungs: diminished breath sounds bilaterally and wheezes bilaterally Heart: S1, S2 normal    Assessment:    Acute Bronchitis and Asthma    Plan:    Antibiotics per medication orders. Avoid exposure to tobacco smoke and fumes. B-agonist inhaler. Call if shortness of breath worsens, blood in sputum, change in character of cough, development of fever or chills, inability to maintain nutrition and hydration. Avoid exposure to tobacco smoke and fumes. Steroid inhaler as ordered.

## 2012-05-28 NOTE — Patient Instructions (Signed)
Asthma Attack Prevention  HOW CAN ASTHMA BE PREVENTED?  Currently, there is no way to prevent asthma from starting. However, you can take steps to control the disease and prevent its symptoms after you have been diagnosed. Learn about your asthma and how to control it. Take an active role to control your asthma by working with your caregiver to create and follow an asthma action plan. An asthma action plan guides you in taking your medicines properly, avoiding factors that make your asthma worse, tracking your level of asthma control, responding to worsening asthma, and seeking emergency care when needed. To track your asthma, keep records of your symptoms, check your peak flow number using a peak flow meter (handheld device that shows how well air moves out of your lungs), and get regular asthma checkups.   Other ways to prevent asthma attacks include:   Use medicines as your caregiver directs.   Identify and avoid things that make your asthma worse (as much as you can).   Keep track of your asthma symptoms and level of control.   Get regular checkups for your asthma.   With your caregiver, write a detailed plan for taking medicines and managing an asthma attack. Then be sure to follow your action plan. Asthma is an ongoing condition that needs regular monitoring and treatment.   Identify and avoid asthma triggers. A number of outdoor allergens and irritants (pollen, mold, cold air, air pollution) can trigger asthma attacks. Find out what causes or makes your asthma worse, and take steps to avoid those triggers (see below).   Monitor your breathing. Learn to recognize warning signs of an attack, such as slight coughing, wheezing or shortness of breath. However, your lung function may already decrease before you notice any signs or symptoms, so regularly measure and record your peak airflow with a home peak flow meter.   Identify and treat attacks early. If you act quickly, you're less likely to have a  severe attack. You will also need less medicine to control your symptoms. When your peak flow measurements decrease and alert you to an upcoming attack, take your medicine as instructed, and immediately stop any activity that may have triggered the attack. If your symptoms do not improve, get medical help.   Pay attention to increasing quick-relief inhaler use. If you find yourself relying on your quick-relief inhaler (such as albuterol), your asthma is not under control. See your caregiver about adjusting your treatment.  IDENTIFY AND CONTROL FACTORS THAT MAKE YOUR ASTHMA WORSE  A number of common things can set off or make your asthma symptoms worse (asthma triggers). Keep track of your asthma symptoms for several weeks, detailing all the environmental and emotional factors that are linked with your asthma. When you have an asthma attack, go back to your asthma diary to see which factor, or combination of factors, might have contributed to it. Once you know what these factors are, you can take steps to control many of them.   Allergies: If you have allergies and asthma, it is important to take asthma prevention steps at home. Asthma attacks (worsening of asthma symptoms) can be triggered by allergies, which can cause temporary increased inflammation of your airways. Minimizing contact with the substance to which you are allergic will help prevent an asthma attack.  Animal Dander:    Some people are allergic to the flakes of skin or dried saliva from animals with fur or feathers. Keep these pets out of your home.   If   you can't keep a pet outdoors, keep the pet out of your bedroom and other sleeping areas at all times, and keep the door closed.   Remove carpets and furniture covered with cloth from your home. If that is not possible, keep the pet away from fabric-covered furniture and carpets.  Dust Mites:   Many people with asthma are allergic to dust mites. Dust mites are tiny bugs that are found in every  home, in mattresses, pillows, carpets, fabric-covered furniture, bedcovers, clothes, stuffed toys, fabric, and other fabric-covered items.   Cover your mattress in a special dust-proof cover.   Cover your pillow in a special dust-proof cover, or wash the pillow each week in hot water. Water must be hotter than 130 F to kill dust mites. Cold or warm water used with detergent and bleach can also be effective.   Wash the sheets and blankets on your bed each week in hot water.   Try not to sleep or lie on cloth-covered cushions.   Call ahead when traveling and ask for a smoke-free hotel room. Bring your own bedding and pillows, in case the hotel only supplies feather pillows and down comforters, which may contain dust mites and cause asthma symptoms.   Remove carpets from your bedroom and those laid on concrete, if you can.   Keep stuffed toys out of the bed, or wash the toys weekly in hot water or cooler water with detergent and bleach.  Cockroaches:   Many people with asthma are allergic to the droppings and remains of cockroaches.   Keep food and garbage in closed containers. Never leave food out.   Use poison baits, traps, powders, gels, or paste (for example, boric acid).   If a spray is used to kill cockroaches, stay out of the room until the odor goes away.  Indoor Mold:   Fix leaky faucets, pipes, or other sources of water that have mold around them.   Clean moldy surfaces with a cleaner that has bleach in it.  Pollen and Outdoor Mold:   When pollen or mold spore counts are high, try to keep your windows closed.   Stay indoors with windows closed from late morning to afternoon, if you can. Pollen and some mold spore counts are highest at that time.   Ask your caregiver whether you need to take or increase anti-inflammatory medicine before your allergy season starts.  Irritants:    Tobacco smoke is an irritant. If you smoke, ask your caregiver how you can quit. Ask family members to quit  smoking, too. Do not allow smoking in your home or car.   If possible, do not use a wood-burning stove, kerosene heater, or fireplace. Minimize exposure to all sources of smoke, including incense, candles, fires, and fireworks.   Try to stay away from strong odors and sprays, such as perfume, talcum powder, hair spray, and paints.   Decrease humidity in your home and use an indoor air cleaning device. Reduce indoor humidity to below 60 percent. Dehumidifiers or central air conditioners can do this.   Try to have someone else vacuum for you once or twice a week, if you can. Stay out of rooms while they are being vacuumed and for a short while afterward.   If you vacuum, use a dust mask from a hardware store, a double-layered or microfilter vacuum cleaner bag, or a vacuum cleaner with a HEPA filter.   Sulfites in foods and beverages can be irritants. Do not drink beer or   wine, or eat dried fruit, processed potatoes, or shrimp if they cause asthma symptoms.   Cold air can trigger an asthma attack. Cover your nose and mouth with a scarf on cold or windy days.   Several health conditions can make asthma more difficult to manage, including runny nose, sinus infections, reflux disease, psychological stress, and sleep apnea. Your caregiver will treat these conditions, as well.   Avoid close contact with people who have a cold or the flu, since your asthma symptoms may get worse if you catch the infection from them. Wash your hands thoroughly after touching items that may have been handled by people with a respiratory infection.   Get a flu shot every year to protect against the flu virus, which often makes asthma worse for days or weeks. Also get a pneumonia shot once every five to 10 years.  Drugs:   Aspirin and other painkillers can cause asthma attacks. 10% to 20% of people with asthma have sensitivity to aspirin or a group of painkillers called non-steroidal anti-inflammatory drugs (NSAIDS), such as ibuprofen  and naproxen. These drugs are used to treat pain and reduce fevers. Asthma attacks caused by any of these medicines can be severe and even fatal. These drugs must be avoided in people who have known aspirin sensitive asthma. Products with acetaminophen are considered safe for people who have asthma. It is important that people with aspirin sensitivity read labels of all over-the-counter drugs used to treat pain, colds, coughs, and fever.   Beta blockers and ACE inhibitors are other drugs which you should discuss with your caregiver, in relation to your asthma.  ALLERGY SKIN TESTING   Ask your asthma caregiver about allergy skin testing or blood testing (RAST test) to identify the allergens to which you are sensitive. If you are found to have allergies, allergy shots (immunotherapy) for asthma may help prevent future allergies and asthma. With allergy shots, small doses of allergens (substances to which you are allergic) are injected under your skin on a regular schedule. Over a period of time, your body may become used to the allergen and less responsive with asthma symptoms. You can also take measures to minimize your exposure to those allergens.  EXERCISE   If you have exercise-induced asthma, or are planning vigorous exercise, or exercise in cold, humid, or dry environments, prevent exercise-induced asthma by following your caregiver's advice regarding asthma treatment before exercising.  Document Released: 05/21/2009 Document Revised: 08/25/2011 Document Reviewed: 05/21/2009  [employer] Patient Information 2013 [employer].

## 2012-08-02 ENCOUNTER — Other Ambulatory Visit: Payer: Self-pay | Admitting: Obstetrics and Gynecology

## 2012-08-02 DIAGNOSIS — Z1231 Encounter for screening mammogram for malignant neoplasm of breast: Secondary | ICD-10-CM

## 2012-08-18 ENCOUNTER — Emergency Department (HOSPITAL_COMMUNITY): Payer: BC Managed Care – PPO

## 2012-08-18 ENCOUNTER — Encounter (HOSPITAL_COMMUNITY): Payer: Self-pay | Admitting: *Deleted

## 2012-08-18 ENCOUNTER — Telehealth: Payer: Self-pay | Admitting: Family Medicine

## 2012-08-18 ENCOUNTER — Emergency Department (HOSPITAL_COMMUNITY)
Admission: EM | Admit: 2012-08-18 | Discharge: 2012-08-18 | Disposition: A | Discharge status: Home / Self Care | Payer: BC Managed Care – PPO | Attending: Emergency Medicine | Admitting: Emergency Medicine

## 2012-08-18 DIAGNOSIS — R197 Diarrhea, unspecified: Secondary | ICD-10-CM | POA: Insufficient documentation

## 2012-08-18 DIAGNOSIS — Z8639 Personal history of other endocrine, nutritional and metabolic disease: Secondary | ICD-10-CM | POA: Insufficient documentation

## 2012-08-18 DIAGNOSIS — J45909 Unspecified asthma, uncomplicated: Secondary | ICD-10-CM | POA: Insufficient documentation

## 2012-08-18 DIAGNOSIS — R109 Unspecified abdominal pain: Secondary | ICD-10-CM

## 2012-08-18 DIAGNOSIS — I10 Essential (primary) hypertension: Secondary | ICD-10-CM | POA: Insufficient documentation

## 2012-08-18 DIAGNOSIS — Z79899 Other long term (current) drug therapy: Secondary | ICD-10-CM | POA: Insufficient documentation

## 2012-08-18 DIAGNOSIS — R112 Nausea with vomiting, unspecified: Secondary | ICD-10-CM

## 2012-08-18 DIAGNOSIS — Z862 Personal history of diseases of the blood and blood-forming organs and certain disorders involving the immune mechanism: Secondary | ICD-10-CM | POA: Insufficient documentation

## 2012-08-18 DIAGNOSIS — R1031 Right lower quadrant pain: Secondary | ICD-10-CM | POA: Insufficient documentation

## 2012-08-18 HISTORY — DX: Essential (primary) hypertension: I10

## 2012-08-18 LAB — URINALYSIS, MICROSCOPIC ONLY
Glucose, UA: NEGATIVE mg/dL
Ketones, ur: 15 mg/dL — AB
Leukocytes, UA: NEGATIVE
Nitrite: NEGATIVE
Protein, ur: 30 mg/dL — AB
Specific Gravity, Urine: 1.038 — ABNORMAL HIGH (ref 1.005–1.030)
Urobilinogen, UA: 0.2 mg/dL (ref 0.0–1.0)
pH: 5.5 (ref 5.0–8.0)

## 2012-08-18 LAB — CBC WITH DIFFERENTIAL/PLATELET
Basophils Absolute: 0 10*3/uL (ref 0.0–0.1)
Basophils Relative: 0 % (ref 0–1)
Eosinophils Absolute: 0.1 10*3/uL (ref 0.0–0.7)
Eosinophils Relative: 1 % (ref 0–5)
HCT: 42.4 % (ref 36.0–46.0)
Hemoglobin: 14.6 g/dL (ref 12.0–15.0)
Lymphocytes Relative: 18 % (ref 12–46)
Lymphs Abs: 1.6 10*3/uL (ref 0.7–4.0)
MCH: 30.3 pg (ref 26.0–34.0)
MCHC: 34.4 g/dL (ref 30.0–36.0)
MCV: 88 fL (ref 78.0–100.0)
Monocytes Absolute: 0.6 10*3/uL (ref 0.1–1.0)
Monocytes Relative: 6 % (ref 3–12)
Neutro Abs: 6.8 10*3/uL (ref 1.7–7.7)
Neutrophils Relative %: 75 % (ref 43–77)
Platelets: 223 10*3/uL (ref 150–400)
RBC: 4.82 MIL/uL (ref 3.87–5.11)
RDW: 12.5 % (ref 11.5–15.5)
WBC: 9.2 10*3/uL (ref 4.0–10.5)

## 2012-08-18 LAB — COMPREHENSIVE METABOLIC PANEL
ALT: 14 U/L (ref 0–35)
AST: 15 U/L (ref 0–37)
Albumin: 3.9 g/dL (ref 3.5–5.2)
Alkaline Phosphatase: 73 U/L (ref 39–117)
BUN: 17 mg/dL (ref 6–23)
CO2: 25 mEq/L (ref 19–32)
Calcium: 9.1 mg/dL (ref 8.4–10.5)
Chloride: 104 mEq/L (ref 96–112)
Creatinine, Ser: 0.98 mg/dL (ref 0.50–1.10)
GFR calc Af Amer: 81 mL/min — ABNORMAL LOW (ref >90)
GFR calc non Af Amer: 70 mL/min — ABNORMAL LOW (ref >90)
Glucose, Bld: 94 mg/dL (ref 70–99)
Potassium: 4 mEq/L (ref 3.5–5.1)
Sodium: 139 mEq/L (ref 135–145)
Total Bilirubin: 0.3 mg/dL (ref 0.3–1.2)
Total Protein: 7.6 g/dL (ref 6.0–8.3)

## 2012-08-18 LAB — LIPASE, BLOOD: Lipase: 36 U/L (ref 11–59)

## 2012-08-18 MED ORDER — IOHEXOL 300 MG/ML  SOLN
100.0000 mL | Freq: Once | INTRAMUSCULAR | Status: AC | PRN
  Administered 2012-08-18: 100 mL via INTRAVENOUS

## 2012-08-18 MED ORDER — ONDANSETRON HCL 4 MG PO TABS: 4.0000 mg | ORAL_TABLET | Freq: Four times a day (QID) | ORAL | Status: DC

## 2012-08-18 MED ORDER — SODIUM CHLORIDE 0.9 % IV BOLUS (SEPSIS)
1000.0000 mL | Freq: Once | INTRAVENOUS | Status: AC
  Administered 2012-08-18: 1000 mL via INTRAVENOUS

## 2012-08-18 MED ORDER — HYDROMORPHONE HCL PF 1 MG/ML IJ SOLN
1.0000 mg | Freq: Once | INTRAMUSCULAR | Status: AC
  Administered 2012-08-18: 1 mg via INTRAVENOUS
  Filled 2012-08-18: qty 1

## 2012-08-18 MED ORDER — IOHEXOL 300 MG/ML  SOLN
25.0000 mL | INTRAMUSCULAR | Status: AC
  Administered 2012-08-18 (×2): 25 mL via ORAL

## 2012-08-18 MED ORDER — ONDANSETRON HCL 4 MG/2ML IJ SOLN
4.0000 mg | Freq: Once | INTRAMUSCULAR | Status: AC
  Administered 2012-08-18: 4 mg via INTRAVENOUS
  Filled 2012-08-18: qty 2

## 2012-08-18 NOTE — Telephone Encounter (Signed)
Patient is in the ED.     KP 

## 2012-08-18 NOTE — ED Notes (Signed)
Pt c/o lower abd pain radiating to RLQ, N/V/D, and decrease appetite starting yesterday. Pt called her PCP and they sent her to ED to r/o appendicitis. Pt reports she is unable to keep anything down, last ate Monday night.

## 2012-08-18 NOTE — ED Notes (Signed)
Pt aware that urine specimen needed. Specimen cup provided to pt.

## 2012-08-18 NOTE — ED Notes (Signed)
Pt reports sudden onset of lower abd pain, n/v/d and fever yesterday. pcp told pt to come here to r/o appendicitis.

## 2012-08-18 NOTE — ED Notes (Signed)
Pt finished her first cup of contrast, CT called and informed

## 2012-08-18 NOTE — Telephone Encounter (Signed)
Patient Information:  Caller Name: Sydne  Phone: [phone]  Patient: Jennifer Charles  Gender: Female  DOB: 11/10/1970  Age: 42 Years  PCP: Lelon Perla.  Pregnant: No  Office Follow Up:  Does the office need to follow up with this patient?: No  Instructions For The Office: N/A  RN Note:  Pt has IUD, placed 2011. Pt had temp 101.0 on 3-4.  Pt can hold down sips of water and urinating.  Pt having constant RLQ to Center Abdominal pain, pain does not improve after vomiting or diarrhea, Office appt offered.  Pt will go to ED to have scan preformed.  Symptoms  Reason For Call & Symptoms: Vomiting, Diarrhea, and Constant RLQ to Center Abdominal Pain  Reviewed Health History In EMR: N/A  Reviewed Medications In EMR: N/A  Reviewed Allergies In EMR: N/A  Reviewed Surgeries / Procedures: N/A  Date of Onset of Symptoms: 08/17/2012 OB / GYN:  LMP: Unknown  Guideline(s) Used:  Vomiting  Disposition Per Guideline:   Go to ED Now (or to Office with PCP Approval)  Reason For Disposition Reached:   Constant abdominal pain lasting > 2 hours  Advice Given:  N/A

## 2012-08-18 NOTE — ED Notes (Signed)
Patient transported to CT 

## 2012-08-23 NOTE — ED Provider Notes (Signed)
History    42yf with abdominal pain. Gradual onset yesterday and progressively worsening. Pain is the right lower quadrant. No radiation. Associated with nausea, vomiting diarrhea. No fevers or chills. Anorexia. No urinary complaints. No sick contacts. Seen by pcp prior to arrival and referred to ED for further eval.   CSN: [id number]  Arrival date & time 08/18/12  1359   First MD Initiated Contact with Patient 08/18/12 1544      Chief Complaint  Patient presents with  . Abdominal Pain  . Emesis  . Fever    (Consider location/radiation/quality/duration/timing/severity/associated sxs/prior treatment) HPI  Past Medical History  Diagnosis Date  . PCOS (polycystic ovarian syndrome)   . Asthma   . Hypertension     Past Surgical History  Procedure Laterality Date  . Cosmetic surgery    . Cystectomy      right ganglion cyst removal    Family History  Problem Relation Age of Onset  . Colon cancer Maternal Aunt   . Colon cancer Maternal Grandmother   . Diabetes Maternal Grandmother   . Coronary artery disease      Father's side  . Prostate cancer Maternal Grandfather   . Kidney cancer Maternal Grandfather   . Cancer Maternal Grandfather     Splenic cancer  . Lung cancer Maternal Grandfather   . Diabetes Mother   . Hypertension Mother   . Heart disease Father 51    MI  . Hyperlipidemia Father   . Hypertension Father   . Cancer Other     colon  . Cancer Other     colon    History  Substance Use Topics  . Smoking status: Passive Smoke Exposure - Never Smoker    Types: Cigarettes  . Smokeless tobacco: Never Used  . Alcohol Use: Yes     Comment: Occ    OB History   Grav Para Term Preterm Abortions TAB SAB Ect Mult Living                  Review of Systems  All systems reviewed and negative, other than as noted in HPI.   Allergies  Review of patient's allergies indicates no known allergies.  Home Medications   Current Outpatient Rx  Name  Route  Sig   Dispense  Refill  . albuterol (PROAIR HFA) 108 (90 BASE) MCG/ACT inhaler   Inhalation   Inhale 2 puffs into the lungs every 6 (six) hours as needed for wheezing.   1 Inhaler   2   . amLODipine (NORVASC) 10 MG tablet   Oral   Take 1 tablet (10 mg total) by mouth daily.   90 tablet   3   . Lorcaserin HCl (BELVIQ) 10 MG TABS   Oral   Take 1 tablet by mouth daily.         . ondansetron (ZOFRAN) 4 MG tablet   Oral   Take 1 tablet (4 mg total) by mouth every 6 (six) hours.   12 tablet   0     BP 129/87  Pulse 87  Temp(Src) 97.7 F (36.5 C) (Oral)  Resp 18  SpO2 99%  Physical Exam  Nursing note and vitals reviewed. Constitutional: She appears well-developed and well-nourished. No distress.  HENT:  Head: Normocephalic and atraumatic.  Eyes: Conjunctivae are normal. Right eye exhibits no discharge. Left eye exhibits no discharge.  Neck: Neck supple.  Cardiovascular: Normal rate, regular rhythm and normal heart sounds.  Exam reveals no gallop and no  friction rub.   No murmur heard. Pulmonary/Chest: Effort normal and breath sounds normal. No respiratory distress.  Abdominal: Soft. She exhibits no distension. There is tenderness.  RLQ tenderness w/o rebound or guarding. No distension.   Genitourinary:  No cva tenderness  Musculoskeletal: She exhibits no edema and no tenderness.  Neurological: She is alert.  Skin: Skin is warm and dry.  Psychiatric: She has a normal mood and affect. Her behavior is normal. Thought content normal.    ED Course  Procedures (including critical care time)  Labs Reviewed  COMPREHENSIVE METABOLIC PANEL - Abnormal; Notable for the following:    GFR calc non Af Amer 70 (*)    GFR calc Af Amer 81 (*)    All other components within normal limits  URINALYSIS, MICROSCOPIC ONLY - Abnormal; Notable for the following:    Color, Urine AMBER (*)    APPearance CLOUDY (*)    Specific Gravity, Urine 1.038 (*)    Hgb urine dipstick LARGE (*)     Bilirubin Urine SMALL (*)    Ketones, ur 15 (*)    Protein, ur 30 (*)    Bacteria, UA MANY (*)    Squamous Epithelial / LPF MANY (*)    All other components within normal limits  CBC WITH DIFFERENTIAL  LIPASE, BLOOD   No results found.  Ct Abdomen Pelvis W Contrast  08/18/2012  *RADIOLOGY REPORT*  Clinical Data: Low abdominal pain, fever.  CT ABDOMEN AND PELVIS WITH CONTRAST  Technique:  Multidetector CT imaging of the abdomen and pelvis was performed following the standard protocol during bolus administration of intravenous contrast.  Contrast: OMNIPAQUE IOHEXOL 300 MG/ML  SOLN  Comparison: None.  Findings: Minimal dependent atelectasis in the visualized lung bases.  Unremarkable liver, gallbladder, spleen, adrenal glands, kidneys, pancreas.  Stomach, small bowel, and colon are nondilated. Normal appendix.  Portal vein patent.  Aorta unremarkable.  Trace pelvic fluid, probably physiologic.  No free air.  IUD in place. Adnexal regions unremarkable.  Urinary bladder incompletely distended.  Lumbar spine intact.  A few prominent central mesenteric and retroperitoneal lymph nodes, none greater than 1 cm short axis diameter.  IMPRESSION:  1.  No acute abdominal process.  Normal appendix.   Original Report Authenticated By: D. Andria Rhein, MD     1. Abdominal pain   2. Nausea vomiting and diarrhea       MDM  42 year old female with right lower quadrant pain. CT abdomen pelvis unremarkable. Labs are reassuring. UA poor specimen. No specific urinary complaints. Will defer from tx at this time. Low suspicion for emergent etiology. Emergent return precautions discussed.         Raeford Razor, MD 08/23/12 [phone]

## 2012-08-25 ENCOUNTER — Ambulatory Visit
Admission: RE | Admit: 2012-08-25 | Discharge: 2012-08-25 | Disposition: A | Discharge status: Home / Self Care | Payer: BC Managed Care – PPO | Source: Ambulatory Visit | Attending: Obstetrics and Gynecology | Admitting: Obstetrics and Gynecology

## 2012-08-25 DIAGNOSIS — Z1231 Encounter for screening mammogram for malignant neoplasm of breast: Secondary | ICD-10-CM

## 2012-09-21 ENCOUNTER — Ambulatory Visit (INDEPENDENT_AMBULATORY_CARE_PROVIDER_SITE_OTHER): Payer: BC Managed Care – PPO | Admitting: Family Medicine

## 2012-09-21 ENCOUNTER — Encounter: Payer: Self-pay | Admitting: Family Medicine

## 2012-09-21 VITALS — BP 144/92 | HR 90 | Temp 98.3°F | Wt 185.6 lb

## 2012-09-21 DIAGNOSIS — R300 Dysuria: Secondary | ICD-10-CM

## 2012-09-21 LAB — POCT URINALYSIS DIPSTICK
Blood, UA: NEGATIVE
Glucose, UA: NEGATIVE
Ketones, UA: NEGATIVE
Nitrite, UA: NEGATIVE
Protein, UA: NEGATIVE
Spec Grav, UA: 1.025
Urobilinogen, UA: 0.2
pH, UA: 6

## 2012-09-21 MED ORDER — CIPROFLOXACIN-CIPROFLOX HCL ER 1000 MG PO TB24: ORAL_TABLET | ORAL | Status: AC

## 2012-09-21 MED ORDER — PHENAZOPYRIDINE HCL 200 MG PO TABS: 200.0000 mg | ORAL_TABLET | Freq: Three times a day (TID) | ORAL | Status: AC | PRN

## 2012-09-21 NOTE — Patient Instructions (Addendum)
Urinary Tract Infection Urinary tract infections (UTIs) can develop anywhere along your urinary tract. Your urinary tract is your body's drainage system for removing wastes and extra water. Your urinary tract includes two kidneys, two ureters, a bladder, and a urethra. Your kidneys are a pair of bean-shaped organs. Each kidney is about the size of your fist. They are located below your ribs, one on each side of your spine. CAUSES Infections are caused by microbes, which are microscopic organisms, including fungi, viruses, and bacteria. These organisms are so small that they can only be seen through a microscope. Bacteria are the microbes that most commonly cause UTIs. SYMPTOMS  Symptoms of UTIs may vary by age and gender of the patient and by the location of the infection. Symptoms in young women typically include a frequent and intense urge to urinate and a painful, burning feeling in the bladder or urethra during urination. Older women and men are more likely to be tired, shaky, and weak and have muscle aches and abdominal pain. A fever may mean the infection is in your kidneys. Other symptoms of a kidney infection include pain in your back or sides below the ribs, nausea, and vomiting. DIAGNOSIS To diagnose a UTI, your caregiver will ask you about your symptoms. Your caregiver also will ask to provide a urine sample. The urine sample will be tested for bacteria and white blood cells. White blood cells are made by your body to help fight infection. TREATMENT  Typically, UTIs can be treated with medication. Because most UTIs are caused by a bacterial infection, they usually can be treated with the use of antibiotics. The choice of antibiotic and length of treatment depend on your symptoms and the type of bacteria causing your infection. HOME CARE INSTRUCTIONS  If you were prescribed antibiotics, take them exactly as your caregiver instructs you. Finish the medication even if you feel better after you  have only taken some of the medication.  Drink enough water and fluids to keep your urine clear or pale yellow.  Avoid caffeine, tea, and carbonated beverages. They tend to irritate your bladder.  Empty your bladder often. Avoid holding urine for long periods of time.  Empty your bladder before and after sexual intercourse.  After a bowel movement, women should cleanse from front to back. Use each tissue only once. SEEK MEDICAL CARE IF:   You have back pain.  You develop a fever.  Your symptoms do not begin to resolve within 3 days. SEEK IMMEDIATE MEDICAL CARE IF:   You have severe back pain or lower abdominal pain.  You develop chills.  You have nausea or vomiting.  You have continued burning or discomfort with urination. MAKE SURE YOU:   Understand these instructions.  Will watch your condition.  Will get help right away if you are not doing well or get worse. Document Released: 03/12/2005 Document Revised: 12/02/2011 Document Reviewed: 07/11/2011 Exit[employer] Patient Information 2013 [employer].  

## 2012-09-21 NOTE — Progress Notes (Signed)
  Subjective:    Jennifer Charles is a 42 y.o. female who complains of abnormal smelling urine, burning with urination, frequency and urgency. She has had symptoms for 6 days. Patient also complains of none. Patient denies back pain, congestion, cough, fever, headache, rhinitis, sorethroat, stomach ache and vaginal discharge. Patient does not have a history of recurrent UTI. Patient does not have a history of pyelonephritis.   The following portions of the patient's history were reviewed and updated as appropriate: allergies, current medications, past family history, past medical history, past social history, past surgical history and problem list.  Review of Systems Pertinent items are noted in HPI.    Objective:    BP 144/92  Pulse 90  Temp(Src) 98.3 F (36.8 C) (Oral)  Wt 185 lb 9.6 oz (84.188 kg)  BMI 29.06 kg/m2  SpO2 96% General appearance: alert, cooperative, appears stated age and no distress  Laboratory:  Urine dipstick: 1+ for leukocyte esterase and trace for urobilinogen.   Micro exam: not done.    Assessment:    dysuria     Plan:    Medications: ciprofloxacin. Maintain adequate hydration. Follow up if symptoms not improving, and as needed. culture sent,  f/u prn

## 2012-09-22 ENCOUNTER — Telehealth: Payer: Self-pay | Admitting: Family Medicine

## 2012-09-22 ENCOUNTER — Telehealth: Payer: Self-pay | Admitting: *Deleted

## 2012-09-22 NOTE — Telephone Encounter (Signed)
Call from pharmacy advising the Rx that was sent is no longer manufactured. Per Dr.Tabori ok to fill Cipro 500 BID #10. Rx given directly to the pharmacist. Called patient and lmtc on VM. KP  

## 2012-09-22 NOTE — Telephone Encounter (Signed)
Hospital doctor, Associate Professor at [employer] at [employer] Phone: [phone] called regaring E-script received for Cipro ER 1000 mg.  Reports medication was discontinued by manufacturer.  RN unable tor Veterinary surgeon on back line; transferred to [place] in scheduling.  Please call back.

## 2012-09-22 NOTE — Telephone Encounter (Signed)
Its only really indicated for 2 days----  Can we just send cipro to different pharmacy.?

## 2012-09-22 NOTE — Telephone Encounter (Signed)
Call from pharmacy advising the Rx that was sent is no longer manufactured. Per Dr.Tabori ok to fill Cipro 500 BID #10. Rx given directly to the pharmacist. Called patient and lmtc on VM. KP

## 2012-09-22 NOTE — Telephone Encounter (Signed)
Pt states that pharmacy is out of cipro and will not be able to get it in until this evening. Pt would like to know if she can get some additional PYRIDIUM to hold her over until she gets the cipro in her systems since she was suppose to be taking this with cipro to calm symptoms. .Please advise

## 2012-09-24 LAB — URINE CULTURE: Colony Count: >=100000

## 2012-09-27 ENCOUNTER — Telehealth: Payer: Self-pay | Admitting: Family Medicine

## 2012-09-27 MED ORDER — NITROFURANTOIN MONOHYD MACRO 100 MG PO CAPS: 100.0000 mg | ORAL_CAPSULE | Freq: Two times a day (BID) | ORAL | Status: AC

## 2012-09-27 NOTE — Telephone Encounter (Signed)
Rx faxed and VM left making the patient aware.       KP 

## 2012-09-27 NOTE — Telephone Encounter (Signed)
macrobid 1 po bid for 7 days---- recheck urine when done

## 2012-09-27 NOTE — Telephone Encounter (Signed)
Patient Information:  Caller Name: Cheryel  Phone: [phone]  Patient: Jennifer Charles, Jennifer Charles  Gender: Female  DOB: Sep 12, 1970  Age: 42 Years  PCP: Lelon Perla.  Pregnant: No  Office Follow Up:  Does the office need to follow up with this patient?: Yes  Instructions For The Office: Please call pt and advise   Symptoms  Reason For Call & Symptoms: Pt was in on 09/21/12 - she was dx with a UTI. She was put on Cipro BID which she finished this am. Pt is calling back to say she has had minimal relief. She went from a pain level of 8 when she urinates and urgency/frequency to a 3 but she is still uncomfortable there. No fever/no blood. No abdominal or back pain. Pt leaves for a buisness trip to [place] in the am and is concerned that she still has the UTI. Could MD call in something stronger? She would need to pick it up today.  Pt uses [facility] on [address] and [address].  Reviewed Health History In EMR: Yes  Reviewed Medications In EMR: Yes  Reviewed Allergies In EMR: Yes  Reviewed Surgeries / Procedures: Yes  Date of Onset of Symptoms: 09/21/2012  Treatments Tried: finished prescribed course of Cipro BID  Treatments Tried Worked: No OB / GYN:  LMP: Unknown  Guideline(s) Used:  Urinalysis Results Follow-Up Call  Urination Pain - Female  Disposition Per Guideline:   See Today in Office  Reason For Disposition Reached:   Taking antibiotic > 3 days for UTI and painful urination not improved  Advice Given:  N/A  Patient Refused Recommendation:  Patient Requests Prescription  Asking if stronger abx can be called in for ongoing sx.

## 2013-01-24 ENCOUNTER — Other Ambulatory Visit: Payer: Self-pay | Admitting: *Deleted

## 2013-01-24 DIAGNOSIS — I10 Essential (primary) hypertension: Secondary | ICD-10-CM

## 2013-01-24 MED ORDER — AMLODIPINE BESYLATE 10 MG PO TABS: 10.0000 mg | ORAL_TABLET | Freq: Every day | ORAL | Status: AC

## 2013-01-24 NOTE — Telephone Encounter (Signed)
Rx was filled for Amlodipine 10 mg AG cma.

## 2013-09-22 ENCOUNTER — Other Ambulatory Visit: Payer: Self-pay

## 2013-09-22 DIAGNOSIS — Z1231 Encounter for screening mammogram for malignant neoplasm of breast: Secondary | ICD-10-CM

## 2013-10-25 ENCOUNTER — Ambulatory Visit: Payer: BC Managed Care – PPO

## 2013-11-11 ENCOUNTER — Ambulatory Visit: Payer: Self-pay | Admitting: Podiatrist

## 2015-04-11 ENCOUNTER — Other Ambulatory Visit: Payer: Self-pay | Admitting: Gastroenterology

## 2015-12-07 ENCOUNTER — Ambulatory Visit: Payer: Self-pay | Admitting: Family Medicine

## 2016-02-14 DIAGNOSIS — D225 Melanocytic nevi of trunk: Secondary | ICD-10-CM | POA: Diagnosis not present

## 2016-02-14 DIAGNOSIS — D2270 Melanocytic nevi of unspecified lower limb, including hip: Secondary | ICD-10-CM | POA: Diagnosis not present

## 2016-02-19 ENCOUNTER — Other Ambulatory Visit: Payer: Self-pay | Admitting: Gastroenterology

## 2016-02-19 DIAGNOSIS — R140 Abdominal distension (gaseous): Secondary | ICD-10-CM

## 2016-02-19 DIAGNOSIS — R109 Unspecified abdominal pain: Secondary | ICD-10-CM

## 2016-02-19 DIAGNOSIS — K219 Gastro-esophageal reflux disease without esophagitis: Secondary | ICD-10-CM | POA: Diagnosis not present

## 2016-02-28 ENCOUNTER — Ambulatory Visit
Admission: RE | Admit: 2016-02-28 | Discharge: 2016-02-28 | Disposition: A | Discharge status: Home / Self Care | Payer: BLUE CROSS/BLUE SHIELD | Source: Ambulatory Visit | Attending: Gastroenterology | Admitting: Gastroenterology

## 2016-02-28 DIAGNOSIS — R109 Unspecified abdominal pain: Secondary | ICD-10-CM

## 2016-02-28 DIAGNOSIS — R140 Abdominal distension (gaseous): Secondary | ICD-10-CM

## 2016-08-12 DIAGNOSIS — D1800 Hemangioma unspecified site: Secondary | ICD-10-CM | POA: Diagnosis not present

## 2016-08-12 DIAGNOSIS — L821 Other seborrheic keratosis: Secondary | ICD-10-CM | POA: Diagnosis not present

## 2016-08-12 DIAGNOSIS — Z86018 Personal history of other benign neoplasm: Secondary | ICD-10-CM | POA: Diagnosis not present

## 2016-08-12 DIAGNOSIS — L814 Other melanin hyperpigmentation: Secondary | ICD-10-CM | POA: Diagnosis not present

## 2016-08-14 ENCOUNTER — Emergency Department (HOSPITAL_COMMUNITY)
Admission: EM | Admit: 2016-08-14 | Discharge: 2016-08-14 | Disposition: A | Discharge status: Home / Self Care | Payer: BLUE CROSS/BLUE SHIELD | Attending: Emergency Medicine | Admitting: Emergency Medicine

## 2016-08-14 ENCOUNTER — Emergency Department (HOSPITAL_COMMUNITY): Payer: BLUE CROSS/BLUE SHIELD

## 2016-08-14 ENCOUNTER — Encounter (HOSPITAL_COMMUNITY): Payer: Self-pay

## 2016-08-14 DIAGNOSIS — R42 Dizziness and giddiness: Secondary | ICD-10-CM | POA: Insufficient documentation

## 2016-08-14 DIAGNOSIS — J45909 Unspecified asthma, uncomplicated: Secondary | ICD-10-CM | POA: Diagnosis not present

## 2016-08-14 DIAGNOSIS — R079 Chest pain, unspecified: Secondary | ICD-10-CM | POA: Insufficient documentation

## 2016-08-14 DIAGNOSIS — Z79899 Other long term (current) drug therapy: Secondary | ICD-10-CM | POA: Diagnosis not present

## 2016-08-14 DIAGNOSIS — Z7722 Contact with and (suspected) exposure to environmental tobacco smoke (acute) (chronic): Secondary | ICD-10-CM | POA: Diagnosis not present

## 2016-08-14 DIAGNOSIS — I10 Essential (primary) hypertension: Secondary | ICD-10-CM | POA: Diagnosis not present

## 2016-08-14 LAB — BASIC METABOLIC PANEL
Anion gap: 9 (ref 5–15)
BUN: 9 mg/dL (ref 6–20)
CO2: 21 mmol/L — ABNORMAL LOW (ref 22–32)
Calcium: 8.7 mg/dL — ABNORMAL LOW (ref 8.9–10.3)
Chloride: 106 mmol/L (ref 101–111)
Creatinine, Ser: 0.82 mg/dL (ref 0.44–1.00)
GFR calc Af Amer: >60 mL/min (ref >60)
GFR calc non Af Amer: >60 mL/min (ref >60)
Glucose, Bld: 147 mg/dL — ABNORMAL HIGH (ref 65–99)
Potassium: 3.9 mmol/L (ref 3.5–5.1)
Sodium: 136 mmol/L (ref 135–145)

## 2016-08-14 LAB — CBC
HCT: 40.6 % (ref 36.0–46.0)
Hemoglobin: 13.4 g/dL (ref 12.0–15.0)
MCH: 29.2 pg (ref 26.0–34.0)
MCHC: 33 g/dL (ref 30.0–36.0)
MCV: 88.5 fL (ref 78.0–100.0)
Platelets: 258 10*3/uL (ref 150–400)
RBC: 4.59 MIL/uL (ref 3.87–5.11)
RDW: 12.9 % (ref 11.5–15.5)
WBC: 8.6 10*3/uL (ref 4.0–10.5)

## 2016-08-14 LAB — I-STAT TROPONIN, ED: Troponin i, poc: 0 ng/mL (ref 0.00–0.08)

## 2016-08-14 NOTE — ED Notes (Signed)
Patient transported to X-ray 

## 2016-08-14 NOTE — ED Notes (Signed)
ED Provider at bedside. 

## 2016-08-14 NOTE — ED Triage Notes (Signed)
Pt reports she was sent from UC for dizziness and intermittent tingling in her left arm and throbbing in her right foot. Pt alert and oriented, no neuro deficits noted otherwise.

## 2016-08-14 NOTE — ED Notes (Signed)
Spoke to EDP regarding symptoms. No code stroke activated at this time. Will order basic labs.

## 2016-08-14 NOTE — ED Provider Notes (Signed)
[facility]-EMERGENCY DEPT Provider Note   CSN: [id number] Arrival date & time: 08/14/16  1420     History   Chief Complaint Chief Complaint  Patient presents with  . Dizziness    HPI Jennifer Charles is a 46 y.o. female.  HPI Patient presents with dizziness and some tingling in her left chest and left arm. States she checked her blood pressure today and it was elevated. Occasional slight headache. No fevers. No vision changes. Former smoker. History of polycystic ovarian syndrome. Father had his first heart attack at 39. No weakness. No confusion. She said that she felt a little DC earlier. Cannot like when you stand up too fast. Did not feel drunk or did not feel the room spinning.   Past Medical History:  Diagnosis Date  . Asthma   . Hypertension   . PCOS (polycystic ovarian syndrome)     Patient Active Problem List   Diagnosis Date Noted  . Asthma 02/12/2012  . GERD (gastroesophageal reflux disease) 01/06/2012  . OTITIS MEDIA 02/19/2010  . ALLERGIC RHINITIS 02/19/2010  . SINUSITIS - ACUTE-NOS 07/24/2009  . GASTROENTERITIS 02/07/2009  . POLYCYSTIC OVARIES 12/28/2008  . INSOMNIA-SLEEP DISORDER-UNSPEC 12/28/2008  . BRONCHITIS, ACUTE WITH BRONCHOSPASM 05/04/2008  . COUGH 05/04/2008    Past Surgical History:  Procedure Laterality Date  . COSMETIC SURGERY    . CYSTECTOMY     right ganglion cyst removal    OB History    No data available       Home Medications    Prior to Admission medications   Medication Sig Start Date End Date Taking? Authorizing Provider  albuterol (PROAIR HFA) 108 (90 BASE) MCG/ACT inhaler Inhale 2 puffs into the lungs every 6 (six) hours as needed for wheezing. 05/28/12 05/28/13  Grayling Congress Lowne Chase, DO  amLODipine (NORVASC) 10 MG tablet Take 1 tablet (10 mg total) by mouth daily. 01/24/13   Grayling Congress Lowne Chase, DO  CIPROFLOX HCL-CIPRO BETAINE 1000 MG TB24 1 po qd 09/21/12   Grayling Congress Lowne Chase, DO  Lorcaserin HCl (BELVIQ) 10 MG TABS Take 1  tablet by mouth daily.    Historical Provider, MD  nitrofurantoin, macrocrystal-monohydrate, (MACROBID) 100 MG capsule Take 1 capsule (100 mg total) by mouth 2 (two) times daily. 09/27/12   Lelon Perla Chase, DO  phenazopyridine (PYRIDIUM) 200 MG tablet Take 1 tablet (200 mg total) by mouth 3 (three) times daily as needed for pain. 09/21/12   Donato Schultz, DO    Family History Family History  Problem Relation Age of Onset  . Diabetes Mother   . Hypertension Mother   . Heart disease Father 22    MI  . Hyperlipidemia Father   . Hypertension Father   . Cancer Other     colon  . Cancer Other     colon  . Colon cancer Maternal Aunt   . Colon cancer Maternal Grandmother   . Diabetes Maternal Grandmother   . Coronary artery disease      Father's side  . Prostate cancer Maternal Grandfather   . Kidney cancer Maternal Grandfather   . Cancer Maternal Grandfather     Splenic cancer  . Lung cancer Maternal Grandfather     Social History Social History  Substance Use Topics  . Smoking status: Passive Smoke Exposure - Never Smoker    Types: Cigarettes  . Smokeless tobacco: Never Used  . Alcohol use Yes     Comment: Occ     Allergies  Patient has no known allergies.   Review of Systems Review of Systems  Constitutional: Negative for activity change and appetite change.  HENT: Negative for congestion.   Eyes: Negative for pain.  Respiratory: Negative for chest tightness and shortness of breath.   Cardiovascular: Positive for chest pain. Negative for leg swelling.  Gastrointestinal: Negative for abdominal pain, diarrhea, nausea and vomiting.  Genitourinary: Negative for flank pain.  Musculoskeletal: Negative for back pain and neck stiffness.  Skin: Negative for rash.  Neurological: Positive for numbness and headaches. Negative for weakness.  Psychiatric/Behavioral: Negative for behavioral problems.     Physical Exam Updated Vital Signs BP 148/93 (BP Location:  Right Arm)   Pulse 80   Temp 98.2 F (36.8 C) (Oral)   Resp 16   Ht 5\' 7"  (1.702 m)   Wt 212 lb (96.2 kg)   SpO2 97%   BMI 33.20 kg/m   Physical Exam  Constitutional: She appears well-developed.  HENT:  Head: Atraumatic.  Eyes:  Mild nystagmus with end gaze with patient to left or right.  Neck: Neck supple.  Cardiovascular: Normal rate.   Pulmonary/Chest: Effort normal.  Abdominal: Soft. There is no tenderness.  Musculoskeletal: She exhibits no edema.  Neurological: She is alert.  Mild nystagmus with gaze to left or right. Finger-nose intact bilaterally. Face symmetric. Good grip strength bilaterally.  Skin: Skin is warm. Capillary refill takes less than 2 seconds.  Psychiatric: She has a normal mood and affect.     ED Treatments / Results  Labs (all labs ordered are listed, but only abnormal results are displayed) Labs Reviewed  BASIC METABOLIC PANEL - Abnormal; Notable for the following:       Result Value   CO2 21 (*)    Glucose, Bld 147 (*)    Calcium 8.7 (*)    All other components within normal limits  CBC  I-STAT TROPOININ, ED    EKG  EKG Interpretation  Date/Time:  Thursday August 14 2016 14:47:49 EST Ventricular Rate:  98 PR Interval:  134 QRS Duration: 90 QT Interval:  404 QTC Calculation: 515 R Axis:   52 Text Interpretation:  Normal sinus rhythm Low voltage QRS Nonspecific T wave abnormality Abnormal ECG Confirmed by Rubin Payor  MD, Harrold Donath [id number]) on 08/14/2016 7:34:00 PM       Radiology Dg Chest 2 View  Result Date: 08/14/2016 CLINICAL DATA:  Intermittent left-sided chest pain since this morning. EXAM: CHEST  2 VIEW COMPARISON:  None. FINDINGS: The cardiomediastinal contours are normal. Streaky opacities in the lingula. Pulmonary vasculature is normal. No consolidation, pleural effusion, or pneumothorax. No acute osseous abnormalities are seen. IMPRESSION: Streaky lingular atelectasis. Electronically Signed   By: Rubye Oaks M.D.   On:  08/14/2016 20:11    Procedures Procedures (including critical care time)  Medications Ordered in ED Medications - No data to display   Initial Impression / Assessment and Plan / ED Course  I have reviewed the triage vital signs and the nursing notes.  Pertinent labs & imaging results that were available during my care of the patient were reviewed by me and considered in my medical decision making (see chart for details).     Patient with chest pain and dizziness. EKG reassuring. Enzymes negative. Chest x-ray reassuring. Had mild hypertension. Does have history of hypertension. Exam reassuring. Doubt pulmonary embolism. Doubt cardiac cause. She does however have a family history. Will have follow-up with cardiology. Will discharge home.  Final Clinical Impressions(s) / ED Diagnoses  Final diagnoses:  Dizziness  Chest pain, unspecified type    New Prescriptions New Prescriptions   No medications on file     Benjiman CoreNathan Khanh Tanori, MD 08/14/16 2100

## 2016-10-31 DIAGNOSIS — I10 Essential (primary) hypertension: Secondary | ICD-10-CM | POA: Diagnosis not present

## 2016-10-31 DIAGNOSIS — R635 Abnormal weight gain: Secondary | ICD-10-CM | POA: Diagnosis not present

## 2016-10-31 DIAGNOSIS — R7301 Impaired fasting glucose: Secondary | ICD-10-CM | POA: Diagnosis not present

## 2016-10-31 DIAGNOSIS — E669 Obesity, unspecified: Secondary | ICD-10-CM | POA: Diagnosis not present

## 2016-12-22 DIAGNOSIS — J018 Other acute sinusitis: Secondary | ICD-10-CM | POA: Diagnosis not present

## 2016-12-22 DIAGNOSIS — J069 Acute upper respiratory infection, unspecified: Secondary | ICD-10-CM | POA: Diagnosis not present

## 2017-07-19 DIAGNOSIS — M2550 Pain in unspecified joint: Secondary | ICD-10-CM | POA: Diagnosis not present

## 2017-07-19 DIAGNOSIS — J019 Acute sinusitis, unspecified: Secondary | ICD-10-CM | POA: Diagnosis not present

## 2017-07-19 DIAGNOSIS — J209 Acute bronchitis, unspecified: Secondary | ICD-10-CM | POA: Diagnosis not present

## 2017-12-23 DIAGNOSIS — D1800 Hemangioma unspecified site: Secondary | ICD-10-CM | POA: Diagnosis not present

## 2017-12-23 DIAGNOSIS — Z86018 Personal history of other benign neoplasm: Secondary | ICD-10-CM | POA: Diagnosis not present

## 2017-12-23 DIAGNOSIS — L814 Other melanin hyperpigmentation: Secondary | ICD-10-CM | POA: Diagnosis not present

## 2017-12-23 DIAGNOSIS — L821 Other seborrheic keratosis: Secondary | ICD-10-CM | POA: Diagnosis not present

## 2018-01-17 DIAGNOSIS — I10 Essential (primary) hypertension: Secondary | ICD-10-CM | POA: Diagnosis not present

## 2018-01-17 DIAGNOSIS — R51 Headache: Secondary | ICD-10-CM | POA: Diagnosis not present

## 2018-02-24 DIAGNOSIS — E669 Obesity, unspecified: Secondary | ICD-10-CM | POA: Diagnosis not present

## 2018-02-24 DIAGNOSIS — R635 Abnormal weight gain: Secondary | ICD-10-CM | POA: Diagnosis not present

## 2018-02-24 DIAGNOSIS — Z23 Encounter for immunization: Secondary | ICD-10-CM | POA: Diagnosis not present

## 2018-02-24 DIAGNOSIS — I10 Essential (primary) hypertension: Secondary | ICD-10-CM | POA: Diagnosis not present

## 2018-02-24 DIAGNOSIS — R7301 Impaired fasting glucose: Secondary | ICD-10-CM | POA: Diagnosis not present

## 2018-11-15 DIAGNOSIS — E669 Obesity, unspecified: Secondary | ICD-10-CM | POA: Diagnosis not present

## 2018-11-15 DIAGNOSIS — R635 Abnormal weight gain: Secondary | ICD-10-CM | POA: Diagnosis not present

## 2018-11-15 DIAGNOSIS — R7301 Impaired fasting glucose: Secondary | ICD-10-CM | POA: Diagnosis not present

## 2018-11-15 DIAGNOSIS — I10 Essential (primary) hypertension: Secondary | ICD-10-CM | POA: Diagnosis not present

## 2018-11-15 DIAGNOSIS — Z03818 Encounter for observation for suspected exposure to other biological agents ruled out: Secondary | ICD-10-CM | POA: Diagnosis not present

## 2018-12-28 DIAGNOSIS — D225 Melanocytic nevi of trunk: Secondary | ICD-10-CM | POA: Diagnosis not present

## 2018-12-28 DIAGNOSIS — L821 Other seborrheic keratosis: Secondary | ICD-10-CM | POA: Diagnosis not present

## 2018-12-28 DIAGNOSIS — L814 Other melanin hyperpigmentation: Secondary | ICD-10-CM | POA: Diagnosis not present

## 2018-12-28 DIAGNOSIS — Z86018 Personal history of other benign neoplasm: Secondary | ICD-10-CM | POA: Diagnosis not present

## 2019-03-02 DIAGNOSIS — R232 Flushing: Secondary | ICD-10-CM | POA: Diagnosis not present

## 2019-03-02 DIAGNOSIS — Z6836 Body mass index (BMI) 36.0-36.9, adult: Secondary | ICD-10-CM | POA: Diagnosis not present

## 2019-03-02 DIAGNOSIS — Z1231 Encounter for screening mammogram for malignant neoplasm of breast: Secondary | ICD-10-CM | POA: Diagnosis not present

## 2019-03-02 DIAGNOSIS — Z01419 Encounter for gynecological examination (general) (routine) without abnormal findings: Secondary | ICD-10-CM | POA: Diagnosis not present

## 2019-03-02 DIAGNOSIS — Z1151 Encounter for screening for human papillomavirus (HPV): Secondary | ICD-10-CM | POA: Diagnosis not present

## 2019-04-06 DIAGNOSIS — Z23 Encounter for immunization: Secondary | ICD-10-CM | POA: Diagnosis not present

## 2019-05-05 DIAGNOSIS — E669 Obesity, unspecified: Secondary | ICD-10-CM | POA: Diagnosis not present

## 2019-05-05 DIAGNOSIS — R7301 Impaired fasting glucose: Secondary | ICD-10-CM | POA: Diagnosis not present

## 2019-05-05 DIAGNOSIS — I10 Essential (primary) hypertension: Secondary | ICD-10-CM | POA: Diagnosis not present

## 2019-05-17 DIAGNOSIS — E669 Obesity, unspecified: Secondary | ICD-10-CM | POA: Diagnosis not present

## 2019-05-17 DIAGNOSIS — R7301 Impaired fasting glucose: Secondary | ICD-10-CM | POA: Diagnosis not present

## 2019-05-17 DIAGNOSIS — R635 Abnormal weight gain: Secondary | ICD-10-CM | POA: Diagnosis not present

## 2019-05-17 DIAGNOSIS — I10 Essential (primary) hypertension: Secondary | ICD-10-CM | POA: Diagnosis not present

## 2019-05-24 DIAGNOSIS — I10 Essential (primary) hypertension: Secondary | ICD-10-CM | POA: Diagnosis not present

## 2019-06-07 DIAGNOSIS — Z03818 Encounter for observation for suspected exposure to other biological agents ruled out: Secondary | ICD-10-CM | POA: Diagnosis not present

## 2019-07-05 DIAGNOSIS — I10 Essential (primary) hypertension: Secondary | ICD-10-CM | POA: Diagnosis not present

## 2019-11-08 ENCOUNTER — Encounter (HOSPITAL_COMMUNITY): Payer: Self-pay

## 2019-11-08 ENCOUNTER — Emergency Department (HOSPITAL_COMMUNITY): Payer: BC Managed Care – PPO

## 2019-11-08 ENCOUNTER — Other Ambulatory Visit: Payer: Self-pay

## 2019-11-08 ENCOUNTER — Emergency Department (HOSPITAL_COMMUNITY)
Admission: EM | Admit: 2019-11-08 | Discharge: 2019-11-08 | Disposition: A | Discharge status: Home / Self Care | Payer: BC Managed Care – PPO | Attending: Emergency Medicine | Admitting: Emergency Medicine

## 2019-11-08 DIAGNOSIS — J45909 Unspecified asthma, uncomplicated: Secondary | ICD-10-CM | POA: Diagnosis not present

## 2019-11-08 DIAGNOSIS — R109 Unspecified abdominal pain: Secondary | ICD-10-CM | POA: Diagnosis not present

## 2019-11-08 DIAGNOSIS — Z79899 Other long term (current) drug therapy: Secondary | ICD-10-CM | POA: Insufficient documentation

## 2019-11-08 DIAGNOSIS — N390 Urinary tract infection, site not specified: Secondary | ICD-10-CM | POA: Diagnosis not present

## 2019-11-08 DIAGNOSIS — N200 Calculus of kidney: Secondary | ICD-10-CM

## 2019-11-08 DIAGNOSIS — Z7722 Contact with and (suspected) exposure to environmental tobacco smoke (acute) (chronic): Secondary | ICD-10-CM | POA: Insufficient documentation

## 2019-11-08 LAB — URINALYSIS, ROUTINE W REFLEX MICROSCOPIC
Bilirubin Urine: NEGATIVE
Glucose, UA: NEGATIVE mg/dL
Ketones, ur: 5 mg/dL — AB
Leukocytes,Ua: NEGATIVE
Nitrite: NEGATIVE
Protein, ur: 30 mg/dL — AB
RBC / HPF: >50 RBC/hpf — ABNORMAL HIGH (ref 0–5)
Specific Gravity, Urine: 1.025 (ref 1.005–1.030)
pH: 5 (ref 5.0–8.0)

## 2019-11-08 LAB — BASIC METABOLIC PANEL
Anion gap: 12 (ref 5–15)
BUN: 14 mg/dL (ref 6–20)
CO2: 23 mmol/L (ref 22–32)
Calcium: 9.3 mg/dL (ref 8.9–10.3)
Chloride: 104 mmol/L (ref 98–111)
Creatinine, Ser: 1.06 mg/dL — ABNORMAL HIGH (ref 0.44–1.00)
GFR calc Af Amer: >60 mL/min (ref >60)
GFR calc non Af Amer: >60 mL/min (ref >60)
Glucose, Bld: 161 mg/dL — ABNORMAL HIGH (ref 70–99)
Potassium: 3.5 mmol/L (ref 3.5–5.1)
Sodium: 139 mmol/L (ref 135–145)

## 2019-11-08 LAB — CBC
HCT: 42.8 % (ref 36.0–46.0)
Hemoglobin: 13.8 g/dL (ref 12.0–15.0)
MCH: 28.7 pg (ref 26.0–34.0)
MCHC: 32.2 g/dL (ref 30.0–36.0)
MCV: 89 fL (ref 80.0–100.0)
Platelets: 286 10*3/uL (ref 150–400)
RBC: 4.81 MIL/uL (ref 3.87–5.11)
RDW: 13.1 % (ref 11.5–15.5)
WBC: 13.2 10*3/uL — ABNORMAL HIGH (ref 4.0–10.5)
nRBC: 0 % (ref 0.0–0.2)

## 2019-11-08 MED ORDER — OXYCODONE-ACETAMINOPHEN 5-325 MG PO TABS: 1.0000 | ORAL_TABLET | ORAL | 0 refills | Status: AC | PRN

## 2019-11-08 MED ORDER — CEPHALEXIN 500 MG PO CAPS
500.0000 mg | ORAL_CAPSULE | Freq: Four times a day (QID) | ORAL | 0 refills | Status: AC
Start: 2019-11-08 — End: ?

## 2019-11-08 MED ORDER — KETOROLAC TROMETHAMINE 60 MG/2ML IM SOLN
30.0000 mg | Freq: Once | INTRAMUSCULAR | Status: AC
  Administered 2019-11-08: 30 mg via INTRAMUSCULAR
  Filled 2019-11-08: qty 2

## 2019-11-08 MED ORDER — HYDROMORPHONE HCL 1 MG/ML IJ SOLN
1.0000 mg | Freq: Once | INTRAMUSCULAR | Status: AC
  Administered 2019-11-08: 1 mg via INTRAMUSCULAR
  Filled 2019-11-08: qty 1

## 2019-11-08 MED ORDER — ONDANSETRON 8 MG PO TBDP: 8.0000 mg | ORAL_TABLET | Freq: Three times a day (TID) | ORAL | 0 refills | Status: AC | PRN

## 2019-11-08 MED ORDER — TAMSULOSIN HCL 0.4 MG PO CAPS
0.4000 mg | ORAL_CAPSULE | Freq: Every day | ORAL | 0 refills | Status: AC
Start: 2019-11-08 — End: ?

## 2019-11-08 NOTE — ED Provider Notes (Signed)
[facility]-EMERGENCY DEPT Provider Note   CSN: [id number] Arrival date & time: 11/08/19  9518     History Chief Complaint  Patient presents with  . Flank Pain    ARIES KASA is a 49 y.o. female.  49 year old female presents with left-sided flank pain x24 hours.  Pain is been colicky in nature.  No associated fever or chills but did have some nausea and vomiting associated with her discomfort.  Does have a history of right-sided kidney stone and this feels similar.  Denies any diarrhea.  No treatment used prior to arrival        Past Medical History:  Diagnosis Date  . Asthma   . Hypertension   . PCOS (polycystic ovarian syndrome)     Patient Active Problem List   Diagnosis Date Noted  . Asthma 02/12/2012  . GERD (gastroesophageal reflux disease) 01/06/2012  . OTITIS MEDIA 02/19/2010  . ALLERGIC RHINITIS 02/19/2010  . SINUSITIS - ACUTE-NOS 07/24/2009  . GASTROENTERITIS 02/07/2009  . POLYCYSTIC OVARIES 12/28/2008  . INSOMNIA-SLEEP DISORDER-UNSPEC 12/28/2008  . BRONCHITIS, ACUTE WITH BRONCHOSPASM 05/04/2008  . COUGH 05/04/2008    Past Surgical History:  Procedure Laterality Date  . COSMETIC SURGERY    . CYSTECTOMY     right ganglion cyst removal     OB History   No obstetric history on file.     Family History  Problem Relation Age of Onset  . Diabetes Mother   . Hypertension Mother   . Heart disease Father 55       MI  . Hyperlipidemia Father   . Hypertension Father   . Cancer Other        colon  . Cancer Other        colon  . Colon cancer Maternal Aunt   . Colon cancer Maternal Grandmother   . Diabetes Maternal Grandmother   . Coronary artery disease Other        Father's side  . Prostate cancer Maternal Grandfather   . Kidney cancer Maternal Grandfather   . Cancer Maternal Grandfather        Splenic cancer  . Lung cancer Maternal Grandfather     Social History   Tobacco Use  . Smoking status: Passive Smoke  Exposure - Never Smoker  . Smokeless tobacco: Never Used  Substance Use Topics  . Alcohol use: Yes    Comment: Occ  . Drug use: No    Home Medications Prior to Admission medications   Medication Sig Start Date End Date Taking? Authorizing Provider  albuterol (PROAIR HFA) 108 (90 BASE) MCG/ACT inhaler Inhale 2 puffs into the lungs every 6 (six) hours as needed for wheezing. 05/28/12 05/28/13  Donato Schultz, DO  amLODipine (NORVASC) 10 MG tablet Take 1 tablet (10 mg total) by mouth daily. 01/24/13   Zola Button, Grayling Congress, DO  CIPROFLOX HCL-CIPRO BETAINE 1000 MG TB24 1 po qd 09/21/12   Zola Button, Grayling Congress, DO  Lorcaserin HCl (BELVIQ) 10 MG TABS Take 1 tablet by mouth daily.    [provider]  nitrofurantoin, macrocrystal-monohydrate, (MACROBID) 100 MG capsule Take 1 capsule (100 mg total) by mouth 2 (two) times daily. 09/27/12   Donato Schultz, DO  phenazopyridine (PYRIDIUM) 200 MG tablet Take 1 tablet (200 mg total) by mouth 3 (three) times daily as needed for pain. 09/21/12   Donato Schultz, DO    Allergies    Patient has no known allergies.  Review  of Systems   Review of Systems  All other systems reviewed and are negative.   Physical Exam Updated Vital Signs BP (!) 154/94 (BP Location: Right Arm) Comment: Simultaneous filing. User may not have seen previous data.  Pulse 72 Comment: Simultaneous filing. User may not have seen previous data.  Temp 97.8 F (36.6 C) (Oral) Comment: Simultaneous filing. User may not have seen previous data. Comment (Src): Simultaneous filing. User may not have seen previous data.  Resp 20 Comment: Simultaneous filing. User may not have seen previous data.  Ht 1.702 m (5\' 7" )   Wt 98.9 kg   SpO2 94% Comment: Simultaneous filing. User may not have seen previous data.  BMI 34.14 kg/m   Physical Exam Vitals and nursing note reviewed.  Constitutional:      General: She is not in acute distress.    Appearance: Normal  appearance. She is well-developed. She is not toxic-appearing.  HENT:     Head: Normocephalic and atraumatic.  Eyes:     General: Lids are normal.     Conjunctiva/sclera: Conjunctivae normal.     Pupils: Pupils are equal, round, and reactive to light.  Neck:     Thyroid: No thyroid mass.     Trachea: No tracheal deviation.  Cardiovascular:     Rate and Rhythm: Normal rate and regular rhythm.     Heart sounds: Normal heart sounds. No murmur. No gallop.   Pulmonary:     Effort: Pulmonary effort is normal. No respiratory distress.     Breath sounds: Normal breath sounds. No stridor. No decreased breath sounds, wheezing, rhonchi or rales.  Abdominal:     General: Bowel sounds are normal. There is no distension.     Palpations: Abdomen is soft.     Tenderness: There is no abdominal tenderness. There is no left CVA tenderness or rebound.  Musculoskeletal:        General: No tenderness. Normal range of motion.     Cervical back: Normal range of motion and neck supple.  Skin:    General: Skin is warm and dry.     Findings: No abrasion or rash.  Neurological:     Mental Status: She is alert and oriented to person, place, and time.     GCS: GCS eye subscore is 4. GCS verbal subscore is 5. GCS motor subscore is 6.     Cranial Nerves: No cranial nerve deficit.     Sensory: No sensory deficit.  Psychiatric:        Speech: Speech normal.        Behavior: Behavior normal.     ED Results / Procedures / Treatments   Labs (all labs ordered are listed, but only abnormal results are displayed) Labs Reviewed  URINALYSIS, ROUTINE W REFLEX MICROSCOPIC - Abnormal; Notable for the following components:      Result Value   APPearance HAZY (*)    Hgb urine dipstick LARGE (*)    Ketones, ur 5 (*)    Protein, ur 30 (*)    RBC / HPF >50 (*)    Bacteria, UA RARE (*)    All other components within normal limits  CBC - Abnormal; Notable for the following components:   WBC 13.2 (*)    All other  components within normal limits  BASIC METABOLIC PANEL - Abnormal; Notable for the following components:   Glucose, Bld 161 (*)    Creatinine, Ser 1.06 (*)    All other components within normal limits  EKG None  Radiology No results found.  Procedures Procedures (including critical care time)  Medications Ordered in ED Medications  ketorolac (TORADOL) injection 30 mg (has no administration in time range)  HYDROmorphone (DILAUDID) injection 1 mg (has no administration in time range)    ED Course  I have reviewed the triage vital signs and the nursing notes.  Pertinent labs & imaging results that were available during my care of the patient were reviewed by me and considered in my medical decision making (see chart for details).    MDM Rules/Calculators/A&P                     Patient pain is controlled here at this time.  Patient has kidney stone on the left side with no obstruction.  Urinalysis noted and will start antibiotics as well as give prescription for Flomax and opiates and urology referral Final Clinical Impression(s) / ED Diagnoses Final diagnoses:  None    Rx / DC Orders ED Discharge Orders    None       Lorre Nick, MD 11/08/19 1126

## 2019-11-08 NOTE — ED Triage Notes (Signed)
Left flank pain since yesterday

## 2020-03-08 DIAGNOSIS — R7301 Impaired fasting glucose: Secondary | ICD-10-CM | POA: Diagnosis not present

## 2020-03-13 DIAGNOSIS — R7301 Impaired fasting glucose: Secondary | ICD-10-CM | POA: Diagnosis not present

## 2020-03-13 DIAGNOSIS — I10 Essential (primary) hypertension: Secondary | ICD-10-CM | POA: Diagnosis not present

## 2020-03-13 DIAGNOSIS — E669 Obesity, unspecified: Secondary | ICD-10-CM | POA: Diagnosis not present

## 2020-03-13 DIAGNOSIS — R635 Abnormal weight gain: Secondary | ICD-10-CM | POA: Diagnosis not present

## 2020-04-24 DIAGNOSIS — Z1231 Encounter for screening mammogram for malignant neoplasm of breast: Secondary | ICD-10-CM | POA: Diagnosis not present

## 2020-04-24 DIAGNOSIS — Z6835 Body mass index (BMI) 35.0-35.9, adult: Secondary | ICD-10-CM | POA: Diagnosis not present

## 2020-04-24 DIAGNOSIS — Z01419 Encounter for gynecological examination (general) (routine) without abnormal findings: Secondary | ICD-10-CM | POA: Diagnosis not present

## 2020-04-24 DIAGNOSIS — Z30432 Encounter for removal of intrauterine contraceptive device: Secondary | ICD-10-CM | POA: Diagnosis not present

## 2020-07-04 DIAGNOSIS — M25551 Pain in right hip: Secondary | ICD-10-CM | POA: Diagnosis not present

## 2020-07-04 DIAGNOSIS — M542 Cervicalgia: Secondary | ICD-10-CM | POA: Diagnosis not present

## 2020-07-20 DIAGNOSIS — T1512XA Foreign body in conjunctival sac, left eye, initial encounter: Secondary | ICD-10-CM | POA: Diagnosis not present

## 2020-11-29 DIAGNOSIS — N920 Excessive and frequent menstruation with regular cycle: Secondary | ICD-10-CM | POA: Diagnosis not present

## 2020-11-29 DIAGNOSIS — Z30430 Encounter for insertion of intrauterine contraceptive device: Secondary | ICD-10-CM | POA: Diagnosis not present

## 2021-01-09 DIAGNOSIS — Z30431 Encounter for routine checking of intrauterine contraceptive device: Secondary | ICD-10-CM | POA: Diagnosis not present

## 2021-01-09 DIAGNOSIS — N920 Excessive and frequent menstruation with regular cycle: Secondary | ICD-10-CM | POA: Diagnosis not present

## 2021-01-14 DIAGNOSIS — Z5321 Procedure and treatment not carried out due to patient leaving prior to being seen by health care provider: Secondary | ICD-10-CM | POA: Diagnosis not present

## 2021-01-14 DIAGNOSIS — N939 Abnormal uterine and vaginal bleeding, unspecified: Secondary | ICD-10-CM | POA: Diagnosis not present

## 2021-01-14 DIAGNOSIS — R42 Dizziness and giddiness: Secondary | ICD-10-CM | POA: Diagnosis not present

## 2021-01-16 ENCOUNTER — Emergency Department (HOSPITAL_COMMUNITY): Payer: BC Managed Care – PPO

## 2021-01-16 ENCOUNTER — Emergency Department (HOSPITAL_COMMUNITY)
Admission: EM | Admit: 2021-01-16 | Discharge: 2021-01-16 | Disposition: A | Discharge status: Home / Self Care | Payer: BC Managed Care – PPO | Attending: Emergency Medicine | Admitting: Emergency Medicine

## 2021-01-16 ENCOUNTER — Encounter (HOSPITAL_COMMUNITY): Payer: Self-pay

## 2021-01-16 ENCOUNTER — Other Ambulatory Visit: Payer: Self-pay

## 2021-01-16 DIAGNOSIS — Z7722 Contact with and (suspected) exposure to environmental tobacco smoke (acute) (chronic): Secondary | ICD-10-CM | POA: Diagnosis not present

## 2021-01-16 DIAGNOSIS — R457 State of emotional shock and stress, unspecified: Secondary | ICD-10-CM | POA: Insufficient documentation

## 2021-01-16 DIAGNOSIS — J45909 Unspecified asthma, uncomplicated: Secondary | ICD-10-CM | POA: Insufficient documentation

## 2021-01-16 DIAGNOSIS — Z8719 Personal history of other diseases of the digestive system: Secondary | ICD-10-CM | POA: Diagnosis not present

## 2021-01-16 DIAGNOSIS — Z7984 Long term (current) use of oral hypoglycemic drugs: Secondary | ICD-10-CM | POA: Diagnosis not present

## 2021-01-16 DIAGNOSIS — N939 Abnormal uterine and vaginal bleeding, unspecified: Secondary | ICD-10-CM | POA: Diagnosis not present

## 2021-01-16 DIAGNOSIS — R109 Unspecified abdominal pain: Secondary | ICD-10-CM | POA: Diagnosis not present

## 2021-01-16 DIAGNOSIS — I10 Essential (primary) hypertension: Secondary | ICD-10-CM | POA: Diagnosis not present

## 2021-01-16 DIAGNOSIS — K219 Gastro-esophageal reflux disease without esophagitis: Secondary | ICD-10-CM | POA: Insufficient documentation

## 2021-01-16 DIAGNOSIS — N9489 Other specified conditions associated with female genital organs and menstrual cycle: Secondary | ICD-10-CM | POA: Insufficient documentation

## 2021-01-16 DIAGNOSIS — R0789 Other chest pain: Secondary | ICD-10-CM | POA: Diagnosis not present

## 2021-01-16 DIAGNOSIS — Z79899 Other long term (current) drug therapy: Secondary | ICD-10-CM | POA: Insufficient documentation

## 2021-01-16 DIAGNOSIS — R079 Chest pain, unspecified: Secondary | ICD-10-CM | POA: Diagnosis not present

## 2021-01-16 LAB — COMPREHENSIVE METABOLIC PANEL
ALT: 15 U/L (ref 0–44)
AST: 14 U/L — ABNORMAL LOW (ref 15–41)
Albumin: 3.8 g/dL (ref 3.5–5.0)
Alkaline Phosphatase: 64 U/L (ref 38–126)
Anion gap: 8 (ref 5–15)
BUN: 16 mg/dL (ref 6–20)
CO2: 23 mmol/L (ref 22–32)
Calcium: 8.9 mg/dL (ref 8.9–10.3)
Chloride: 106 mmol/L (ref 98–111)
Creatinine, Ser: 1.03 mg/dL — ABNORMAL HIGH (ref 0.44–1.00)
GFR, Estimated: >60 mL/min (ref >60)
Glucose, Bld: 124 mg/dL — ABNORMAL HIGH (ref 70–99)
Potassium: 3.9 mmol/L (ref 3.5–5.1)
Sodium: 137 mmol/L (ref 135–145)
Total Bilirubin: 0.4 mg/dL (ref 0.3–1.2)
Total Protein: 7.1 g/dL (ref 6.5–8.1)

## 2021-01-16 LAB — CBC
HCT: 42.9 % (ref 36.0–46.0)
Hemoglobin: 13.7 g/dL (ref 12.0–15.0)
MCH: 28.9 pg (ref 26.0–34.0)
MCHC: 31.9 g/dL (ref 30.0–36.0)
MCV: 90.5 fL (ref 80.0–100.0)
Platelets: 331 10*3/uL (ref 150–400)
RBC: 4.74 MIL/uL (ref 3.87–5.11)
RDW: 12.9 % (ref 11.5–15.5)
WBC: 9.4 10*3/uL (ref 4.0–10.5)
nRBC: 0 % (ref 0.0–0.2)

## 2021-01-16 LAB — LIPASE, BLOOD: Lipase: 78 U/L — ABNORMAL HIGH (ref 11–51)

## 2021-01-16 LAB — I-STAT BETA HCG BLOOD, ED (MC, WL, AP ONLY): I-stat hCG, quantitative: <5 m[IU]/mL (ref <5)

## 2021-01-16 LAB — TROPONIN I (HIGH SENSITIVITY)
Troponin I (High Sensitivity): 3 ng/L (ref <18)
Troponin I (High Sensitivity): 4 ng/L (ref <18)

## 2021-01-16 MED ORDER — ALUM & MAG HYDROXIDE-SIMETH 200-200-20 MG/5ML PO SUSP
30.0000 mL | Freq: Once | ORAL | Status: AC
  Administered 2021-01-16: 30 mL via ORAL
  Filled 2021-01-16: qty 30

## 2021-01-16 MED ORDER — HYDROXYZINE HCL 25 MG PO TABS
25.0000 mg | ORAL_TABLET | Freq: Four times a day (QID) | ORAL | 0 refills | Status: AC
Start: 2021-01-16 — End: 2021-02-15

## 2021-01-16 MED ORDER — SUCRALFATE 1 G PO TABS
1.0000 g | ORAL_TABLET | Freq: Once | ORAL | Status: AC
  Administered 2021-01-16: 1 g via ORAL
  Filled 2021-01-16: qty 1

## 2021-01-16 MED ORDER — HYDROXYZINE HCL 25 MG PO TABS
25.0000 mg | ORAL_TABLET | Freq: Four times a day (QID) | ORAL | 0 refills | Status: AC
Start: 2021-01-16 — End: 2021-02-08

## 2021-01-16 MED ORDER — LIDOCAINE VISCOUS HCL 2 % MT SOLN
15.0000 mL | Freq: Once | OROMUCOSAL | Status: AC
  Administered 2021-01-16: 15 mL via ORAL
  Filled 2021-01-16: qty 15

## 2021-01-16 MED ORDER — SUCRALFATE 1 G PO TABS: 1.0000 g | ORAL_TABLET | Freq: Three times a day (TID) | ORAL | 0 refills | Status: AC

## 2021-01-16 MED ORDER — SUCRALFATE 1 G PO TABS
1.0000 g | ORAL_TABLET | Freq: Three times a day (TID) | ORAL | 0 refills | Status: AC
Start: 2021-01-16 — End: ?

## 2021-01-16 NOTE — ED Notes (Addendum)
Family at bedside. 

## 2021-01-16 NOTE — Discharge Instructions (Addendum)
Your work-up today was remarkably reassuring.  Please take the Carafate I prescribed you as prescribed specifically I would like you to take it with each meal and he may take it at bedtime as well.  I have also prescribed hydroxyzine which I would like for you to take 3 times a day.  He may take the third dose just before bedtime if you feel that this helps with sleep.  I also recommend taking melatonin 1 hour before bedtime and not using any phone tablets TVs or any other electronic devices 1 hour prior to bedtime as well for sleep.  Please drink plenty of water follow-up with primary care provider.  Malays return to the ER for new or concerning symptoms.

## 2021-01-16 NOTE — ED Triage Notes (Addendum)
Patient complains of chest discomfort and abdominal discomfort intermittently the past 4-5 days. Seen at Rx earlier in week and left prior to completion of assessment. Also has had vaginal bleeding since may and GYN seeing her for that. Also taking HTN med as prescribed but BP running high. Just started the birth control pill 1 week ago for the abnormal bleeding

## 2021-01-16 NOTE — ED Provider Notes (Signed)
[facility] EMERGENCY DEPARTMENT Provider Note   CSN: [id number] Arrival date & time: 01/16/21  1411     History No chief complaint on file.   Jennifer Charles is a 50 y.o. female.  HPI Patient is a 50 year old female presenting today with a history of hypertension, PCOS  Please take the medications I prescribed.  She comes to the ER today because of chest pain.  She states that she has had multiple stressors recently including at work she is stressed she also states that she has been having chest and abdominal discomfort for the past 4 to 5 days which also causes her to feel stressed out.  She was seen at outside hospital earlier this week and left prior to evaluation because of wait time.  She states that she has also been having some vaginal bleeding off and on was told by her OB/GYN that her ultrasound was reassuring and given reassurance was started on an OCP to help with her bleeding.  She states that this has helped some.  She states that she feels concerned that her blood pressure is high and that makes her worried about heart disease/heart attacks with her chest pain.  She states that her chest pain intermittent seems to come and go without any significant provocation.  She states that she is not having significant chest pain right now but it does feel like she has a 2/10 squeezing sensation in her chest.  She states it actually felt better after H&P was conducted because she feels better about her symptoms.  Her symptoms are neither exertional nor pleuritic.  She has no history of VTE, no recent travel or immobilization she is newly on estrogen containing OCP however denies any lower extremity edema swelling or asymmetric swelling or leg tenderness.  No hemoptysis.  No other associate symptoms.  States that she feels that her chest pain is somewhat worse at night patient when she lays down.  She states she has a history of a hiatal hernia.  Was evaluated by GI  years ago and told it was nonoperable surgically she was told lose weight.  She is not seen GI since then.     Past Medical History:  Diagnosis Date   Asthma    Hypertension    PCOS (polycystic ovarian syndrome)     Patient Active Problem List   Diagnosis Date Noted   Asthma 02/12/2012   GERD (gastroesophageal reflux disease) 01/06/2012   OTITIS MEDIA 02/19/2010   ALLERGIC RHINITIS 02/19/2010   SINUSITIS - ACUTE-NOS 07/24/2009   GASTROENTERITIS 02/07/2009   POLYCYSTIC OVARIES 12/28/2008   INSOMNIA-SLEEP DISORDER-UNSPEC 12/28/2008   BRONCHITIS, ACUTE WITH BRONCHOSPASM 05/04/2008   COUGH 05/04/2008    Past Surgical History:  Procedure Laterality Date   COSMETIC SURGERY     CYSTECTOMY     right ganglion cyst removal     OB History   No obstetric history on file.     Family History  Problem Relation Age of Onset   Diabetes Mother    Hypertension Mother    Heart disease Father 58       MI   Hyperlipidemia Father    Hypertension Father    Cancer Other        colon   Cancer Other        colon   Colon cancer Maternal Aunt    Colon cancer Maternal Grandmother    Diabetes Maternal Grandmother    Coronary artery disease Other  Father's side   Prostate cancer Maternal Grandfather    Kidney cancer Maternal Grandfather    Cancer Maternal Grandfather        Splenic cancer   Lung cancer Maternal Grandfather     Social History   Tobacco Use   Smoking status: Passive Smoke Exposure - Never Smoker   Smokeless tobacco: Never  Substance Use Topics   Alcohol use: Yes    Comment: Occ   Drug use: No    Home Medications Prior to Admission medications   Medication Sig Start Date End Date Taking? Authorizing Provider  amLODipine (NORVASC) 5 MG tablet Take 5 mg by mouth every evening. 12/21/20  Yes [provider]  hydrOXYzine (ATARAX/VISTARIL) 25 MG tablet Take 1 tablet (25 mg total) by mouth every 6 (six) hours. 01/16/21 02/15/21 Yes Tawnee Clegg S, PA   hydrOXYzine (ATARAX/VISTARIL) 25 MG tablet Take 1 tablet (25 mg total) by mouth every 6 (six) hours for 23 days. 01/16/21 02/08/21 Yes Tijana Walder S, PA  ketoconazole (NIZORAL) 2 % cream Apply 1 application topically daily as needed for irritation. 10/31/20  Yes [provider]  levonorgestrel (MIRENA) 20 MCG/DAY IUD 1 each by Intrauterine route once.   Yes [provider]  losartan (COZAAR) 25 MG tablet Take 25 mg by mouth every evening. 10/21/19  Yes [provider]  metFORMIN (GLUCOPHAGE-XR) 500 MG 24 hr tablet Take 1,000 mg by mouth every evening. 09/23/19  Yes [provider]  omeprazole (PRILOSEC) 20 MG capsule Take 20 mg by mouth every evening.   Yes [provider]  sucralfate (CARAFATE) 1 g tablet Take 1 tablet (1 g total) by mouth 4 (four) times daily -  with meals and at bedtime. 01/16/21  Yes Ayvin Lipinski S, PA  sucralfate (CARAFATE) 1 g tablet Take 1 tablet (1 g total) by mouth 3 (three) times daily with meals. 01/16/21 02/15/21 Yes Malary Aylesworth S, PA  albuterol (PROAIR HFA) 108 (90 BASE) MCG/ACT inhaler Inhale 2 puffs into the lungs every 6 (six) hours as needed for wheezing. 05/28/12 11/08/19  Donato Schultz, DO  amLODipine (NORVASC) 10 MG tablet Take 1 tablet (10 mg total) by mouth daily. Patient not taking: Reported on 01/16/2021 01/24/13   Zola Button, Grayling Congress, DO  cephALEXin (KEFLEX) 500 MG capsule Take 1 capsule (500 mg total) by mouth 4 (four) times daily. Patient not taking: Reported on 01/16/2021 11/08/19   Lorre Nick, MD  CIPROFLOX HCL-CIPRO BETAINE 1000 MG TB24 1 po qd Patient not taking: No sig reported 09/21/12   Zola Button, Grayling Congress, DO  nitrofurantoin, macrocrystal-monohydrate, (MACROBID) 100 MG capsule Take 1 capsule (100 mg total) by mouth 2 (two) times daily. Patient not taking: No sig reported 09/27/12   Zola Button, Myrene Buddy R, DO  ondansetron (ZOFRAN ODT) 8 MG disintegrating tablet Take 1 tablet (8 mg total) by mouth every 8  (eight) hours as needed for nausea or vomiting. Patient not taking: Reported on 01/16/2021 11/08/19   Lorre Nick, MD  oxyCODONE-acetaminophen (PERCOCET/ROXICET) 5-325 MG tablet Take 1-2 tablets by mouth every 4 (four) hours as needed for severe pain. Patient not taking: Reported on 01/16/2021 11/08/19   Lorre Nick, MD  phenazopyridine (PYRIDIUM) 200 MG tablet Take 1 tablet (200 mg total) by mouth 3 (three) times daily as needed for pain. Patient not taking: No sig reported 09/21/12   Zola Button, Grayling Congress, DO  tamsulosin (FLOMAX) 0.4 MG CAPS capsule Take 1 capsule (0.4 mg total) by mouth daily.  Patient not taking: Reported on 01/16/2021 11/08/19   Lorre Nick, MD    Allergies    Doxycycline  Review of Systems   Review of Systems  Constitutional:  Negative for chills and fever.  HENT:  Negative for congestion.   Eyes:  Negative for pain.  Respiratory:  Negative for cough and shortness of breath.   Cardiovascular:  Positive for chest pain. Negative for leg swelling.  Gastrointestinal:  Positive for abdominal pain. Negative for diarrhea, nausea and vomiting.  Genitourinary:  Negative for dysuria.  Musculoskeletal:  Negative for myalgias.  Skin:  Negative for rash.  Neurological:  Negative for dizziness and headaches.   Physical Exam Updated Vital Signs BP (!) 148/96 (BP Location: Right Arm)   Pulse 82   Temp 98.2 F (36.8 C)   Resp 16   SpO2 98%   Physical Exam Vitals and nursing note reviewed.  Constitutional:      General: She is not in acute distress.    Comments: Pleasant well-appearing 50 year old.  In no acute distress.  Sitting comfortably in bed.  Able answer questions appropriately follow commands. No increased work of breathing. Speaking in full sentences.   HENT:     Head: Normocephalic and atraumatic.     Nose: Nose normal.  Eyes:     General: No scleral icterus. Cardiovascular:     Rate and Rhythm: Normal rate and regular rhythm.     Pulses: Normal pulses.      Heart sounds: Normal heart sounds.  Pulmonary:     Effort: Pulmonary effort is normal. No respiratory distress.     Breath sounds: No wheezing.  Abdominal:     Palpations: Abdomen is soft.     Tenderness: There is no abdominal tenderness. There is no guarding or rebound.     Comments: Abdomen soft, nontender, no guarding or rebound.  Musculoskeletal:     Cervical back: Normal range of motion.     Right lower leg: No edema.     Left lower leg: No edema.  Skin:    General: Skin is warm and dry.     Capillary Refill: Capillary refill takes less than 2 seconds.  Neurological:     Mental Status: She is alert. Mental status is at baseline.  Psychiatric:        Mood and Affect: Mood normal.        Behavior: Behavior normal.    ED Results / Procedures / Treatments   Labs (all labs ordered are listed, but only abnormal results are displayed) Labs Reviewed  COMPREHENSIVE METABOLIC PANEL - Abnormal; Notable for the following components:      Result Value   Glucose, Bld 124 (*)    Creatinine, Ser 1.03 (*)    AST 14 (*)    All other components within normal limits  LIPASE, BLOOD - Abnormal; Notable for the following components:   Lipase 78 (*)    All other components within normal limits  CBC  I-STAT BETA HCG BLOOD, ED ([facility], [facility], [facility] ONLY)  TROPONIN I (HIGH SENSITIVITY)  TROPONIN I (HIGH SENSITIVITY)    EKG None  Radiology DG Chest 2 View  Result Date: 01/16/2021 CLINICAL DATA:  Chest pain EXAM: CHEST - 2 VIEW COMPARISON:  08/14/2016 FINDINGS: Mild linear densities in the lingula compatible with scarring, unchanged. Lungs otherwise clear. No acute infiltrate or edema. No pleural effusion. IMPRESSION: No active cardiopulmonary disease. Electronically Signed   By: Marlan Palau M.D.   On: 01/16/2021 15:07  Procedures Procedures   Medications Ordered in ED Medications  alum & mag hydroxide-simeth (MAALOX/MYLANTA) 200-200-20 MG/5ML suspension 30 mL (30 mLs Oral Given 01/16/21 1852)     And  lidocaine (XYLOCAINE) 2 % viscous mouth solution 15 mL (15 mLs Oral Given 01/16/21 1852)  sucralfate (CARAFATE) tablet 1 g (1 g Oral Given 01/16/21 1852)    ED Course  I have reviewed the triage vital signs and the nursing notes.  Pertinent labs & imaging results that were available during my care of the patient were reviewed by me and considered in my medical decision making (see chart for details).    MDM Rules/Calculators/A&P                           Patient here today with 4 to 5 days of intermittent nonexertional nonpleuritic squeezing sensation in her chest.  Seems to have some chronic ongoing discomfort of her chest with reflux and hiatal hernia.  She also seems to be very anxious.  Although not on examination she seems to have good insight into her anxiety and states that she is currently stressed.  Her symptoms symptoms are exertional or pleuritic.  She has no severe risk factors for PE.  Although she was found to have a transiently elevated heart rate in triage she states she was feeling quite anxious while she was in triage.  On my evaluation she seems quite calm and collected she is able to pleasantly interact with me and give the thorough history.  Her heart rate is below 90 during the entirety of my evaluation.  She is not hypoxic denies any shortness of breath.  She describes her discomfort in her chest as more of a squeezing sensation and tightness.  Troponin x2 within normal limits.  EKG nonischemic no significant axis deviation.  No S1Q3T3.  There is mild tachycardia however, as discussed above, this was taken injury however she was very anxious.  Isolation she needed for pregnancy.  Lipase very marginally elevated.  Patient is a drinks of alcohol she does not have any abdominal pain currently.  Patient given Carafate, GI cocktail and felt somewhat improved.  Will discharge home with strict return cautions.  The medical records were personally reviewed by myself. I  personally reviewed all lab results and interpreted all imaging studies and either concurred with their official read or contacted radiology for clarification. Additional history obtained from old records  This patient appears reasonably screened and I doubt any other medical condition requiring further workup, evaluation, or treatment in the ED at this time prior to discharge.   Patient's vitals are WNL apart from vital sign abnormalities discussed above, patient is in NAD, and able to ambulate in the ED at their baseline and able to tolerate PO.  Pain has been managed or a plan has been made for home management and has no complaints prior to discharge. Patient is comfortable with above plan and for discharge at this time. All questions were answered prior to disposition. Results from the ER workup discussed with the patient face to face and all questions answered to the best of my ability. The patient is safe for discharge with strict return precautions. Patient appears safe for discharge with appropriate follow-up. Conveyed my impression with the patient and they voiced understanding and are agreeable to plan.   An After Visit Summary was printed and given to the patient.  Portions of this note were generated with Scientist, clinical (histocompatibility and immunogenetics)Dragon dictation software.  Dictation errors may occur despite best attempts at proofreading.    Final Clinical Impression(s) / ED Diagnoses Final diagnoses:  Atypical chest pain    Rx / DC Orders ED Discharge Orders          Ordered    sucralfate (CARAFATE) 1 g tablet  3 times daily with meals & bedtime        01/16/21 1834    hydrOXYzine (ATARAX/VISTARIL) 25 MG tablet  Every 6 hours        01/16/21 1834    sucralfate (CARAFATE) 1 g tablet  3 times daily with meals        01/16/21 1901    hydrOXYzine (ATARAX/VISTARIL) 25 MG tablet  Every 6 hours        01/16/21 1901             Gailen Shelter, [place] 01/16/21 1906    Mancel Bale, MD 01/16/21 [phone]

## 2021-01-16 NOTE — ED Provider Notes (Signed)
Emergency Medicine Provider Triage Evaluation Note  Jennifer Charles , a 50 y.o. female  was evaluated in triage.  Pt complains of chest pain abdominal pain.  Has been intermittent for the past 4 to 5 days.  Chest pain is described as cramping.  No associate shortness of breath.  Nausea earlier today, but no vomiting.  Additionally, intermittent abdominal pain, thought to be cramping and due to menstruation.  Patient concerned that her blood pressure is elevated, has been taking BP meds as prescribed.  Review of Systems  Positive: Cp, abd pain Negative: sob  Physical Exam  BP (!) 167/113 (BP Location: Left Arm)   Pulse (!) 114   Temp 98.4 F (36.9 C) (Oral)   Resp 20   SpO2 97%  Gen:   Awake, no distress   Resp:  Normal effort  MSK:   Moves extremities without difficulty  Other:  Mild ttp of upper abd  Medical Decision Making  Medically screening exam initiated at 2:28 PM.  Appropriate orders placed.  [facility] was informed that the remainder of the evaluation will be completed by another provider, this initial triage assessment does not replace that evaluation, and the importance of remaining in the ED until their evaluation is complete.  Labs, ekg, cxr   Alveria Apley, PA-C 01/16/21 1429    Gloris Manchester, MD 01/18/21 [phone]

## 2021-03-11 DIAGNOSIS — F32A Depression, unspecified: Secondary | ICD-10-CM | POA: Diagnosis not present

## 2021-03-11 DIAGNOSIS — F419 Anxiety disorder, unspecified: Secondary | ICD-10-CM | POA: Diagnosis not present

## 2021-03-12 DIAGNOSIS — R7301 Impaired fasting glucose: Secondary | ICD-10-CM | POA: Diagnosis not present

## 2021-03-15 DIAGNOSIS — E1165 Type 2 diabetes mellitus with hyperglycemia: Secondary | ICD-10-CM | POA: Diagnosis not present

## 2021-03-15 DIAGNOSIS — E7800 Pure hypercholesterolemia, unspecified: Secondary | ICD-10-CM | POA: Diagnosis not present

## 2021-03-15 DIAGNOSIS — E669 Obesity, unspecified: Secondary | ICD-10-CM | POA: Diagnosis not present

## 2021-03-15 DIAGNOSIS — I10 Essential (primary) hypertension: Secondary | ICD-10-CM | POA: Diagnosis not present

## 2021-03-18 DIAGNOSIS — F32A Depression, unspecified: Secondary | ICD-10-CM | POA: Diagnosis not present

## 2021-03-18 DIAGNOSIS — F419 Anxiety disorder, unspecified: Secondary | ICD-10-CM | POA: Diagnosis not present

## 2021-03-25 DIAGNOSIS — F419 Anxiety disorder, unspecified: Secondary | ICD-10-CM | POA: Diagnosis not present

## 2021-03-25 DIAGNOSIS — F32A Depression, unspecified: Secondary | ICD-10-CM | POA: Diagnosis not present

## 2021-04-08 DIAGNOSIS — F419 Anxiety disorder, unspecified: Secondary | ICD-10-CM | POA: Diagnosis not present

## 2021-04-08 DIAGNOSIS — F32A Depression, unspecified: Secondary | ICD-10-CM | POA: Diagnosis not present

## 2021-04-25 DIAGNOSIS — F419 Anxiety disorder, unspecified: Secondary | ICD-10-CM | POA: Diagnosis not present

## 2021-04-25 DIAGNOSIS — F32A Depression, unspecified: Secondary | ICD-10-CM | POA: Diagnosis not present

## 2021-05-02 DIAGNOSIS — F419 Anxiety disorder, unspecified: Secondary | ICD-10-CM | POA: Diagnosis not present

## 2021-05-02 DIAGNOSIS — F32A Depression, unspecified: Secondary | ICD-10-CM | POA: Diagnosis not present

## 2021-05-02 DIAGNOSIS — Z01419 Encounter for gynecological examination (general) (routine) without abnormal findings: Secondary | ICD-10-CM | POA: Diagnosis not present

## 2021-05-02 DIAGNOSIS — Z1231 Encounter for screening mammogram for malignant neoplasm of breast: Secondary | ICD-10-CM | POA: Diagnosis not present

## 2021-05-02 DIAGNOSIS — Z6833 Body mass index (BMI) 33.0-33.9, adult: Secondary | ICD-10-CM | POA: Diagnosis not present

## 2021-05-23 DIAGNOSIS — F32A Depression, unspecified: Secondary | ICD-10-CM | POA: Diagnosis not present

## 2021-05-23 DIAGNOSIS — F419 Anxiety disorder, unspecified: Secondary | ICD-10-CM | POA: Diagnosis not present

## 2021-05-30 DIAGNOSIS — F32A Depression, unspecified: Secondary | ICD-10-CM | POA: Diagnosis not present

## 2021-05-30 DIAGNOSIS — F419 Anxiety disorder, unspecified: Secondary | ICD-10-CM | POA: Diagnosis not present

## 2021-06-27 DIAGNOSIS — F32A Depression, unspecified: Secondary | ICD-10-CM | POA: Diagnosis not present

## 2021-06-27 DIAGNOSIS — F419 Anxiety disorder, unspecified: Secondary | ICD-10-CM | POA: Diagnosis not present

## 2021-07-29 DIAGNOSIS — E7800 Pure hypercholesterolemia, unspecified: Secondary | ICD-10-CM | POA: Diagnosis not present

## 2021-07-29 DIAGNOSIS — E1165 Type 2 diabetes mellitus with hyperglycemia: Secondary | ICD-10-CM | POA: Diagnosis not present

## 2021-08-05 DIAGNOSIS — R635 Abnormal weight gain: Secondary | ICD-10-CM | POA: Diagnosis not present

## 2021-08-05 DIAGNOSIS — E1165 Type 2 diabetes mellitus with hyperglycemia: Secondary | ICD-10-CM | POA: Diagnosis not present

## 2021-08-05 DIAGNOSIS — I10 Essential (primary) hypertension: Secondary | ICD-10-CM | POA: Diagnosis not present

## 2021-08-05 DIAGNOSIS — E669 Obesity, unspecified: Secondary | ICD-10-CM | POA: Diagnosis not present

## 2021-08-08 IMAGING — CT CT RENAL STONE PROTOCOL
2 of 4 series · 16 of 46 positions shown, 18 images · non-contrast
Comparison: 08/18/2012

CLINICAL DATA: Left flank pain for 2 days

EXAM:
CT ABDOMEN AND PELVIS WITHOUT CONTRAST
TECHNIQUE: Multidetector CT imaging of the abdomen and pelvis was performed
following the standard protocol without IV contrast.

[Series 2: axial st · axial · 0.83mm/px · z∈[-430,+5]mm · 13 of 99 slices shown, 15 images]
[im 6/99  soft-tissue]
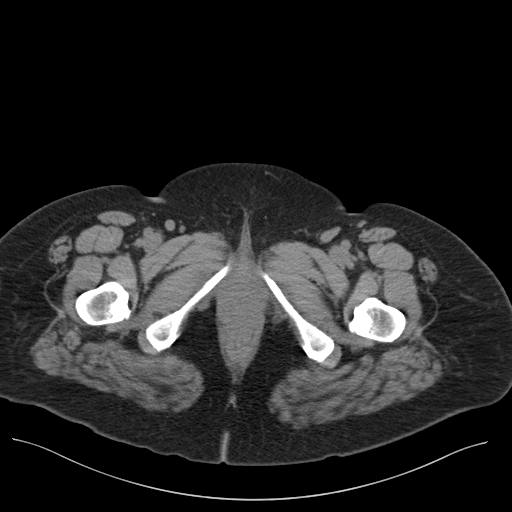
[im 6/99  bone]
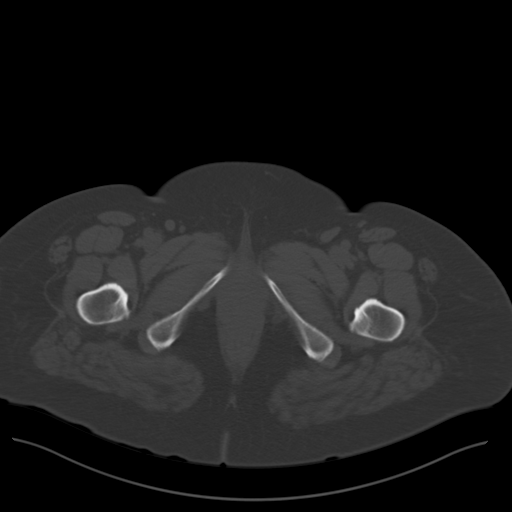
[im 11/99  soft-tissue]
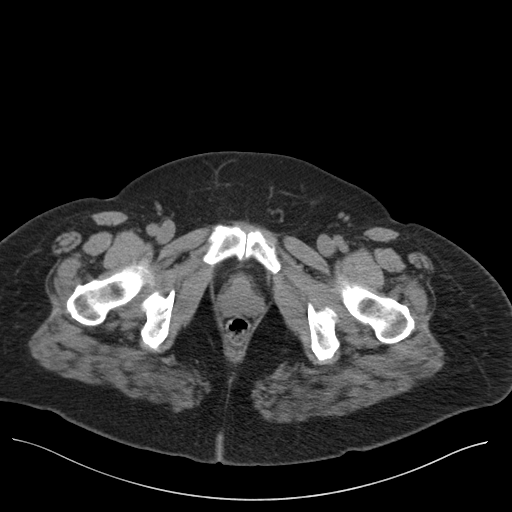
[im 22/99  soft-tissue]
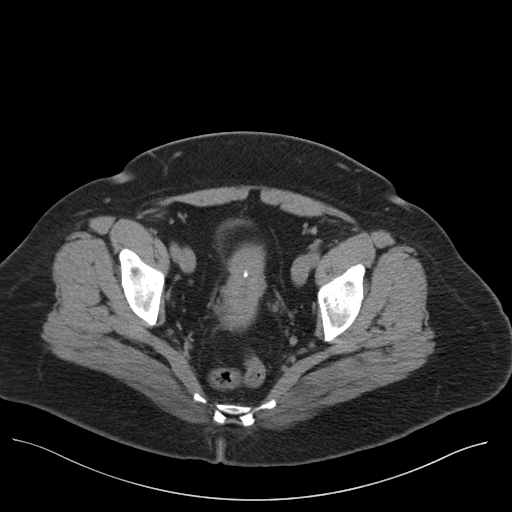
[im 28/99  soft-tissue]
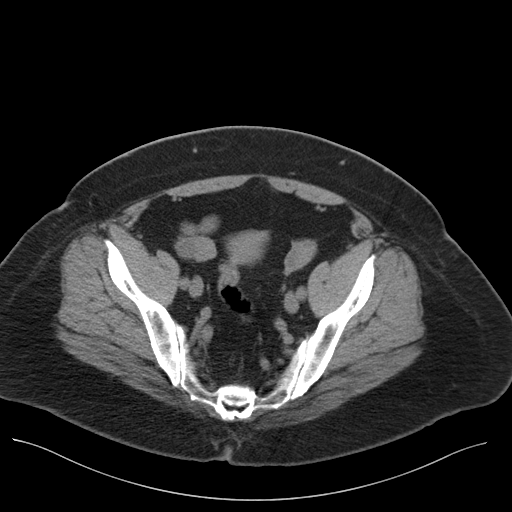
[im 33/99  soft-tissue]
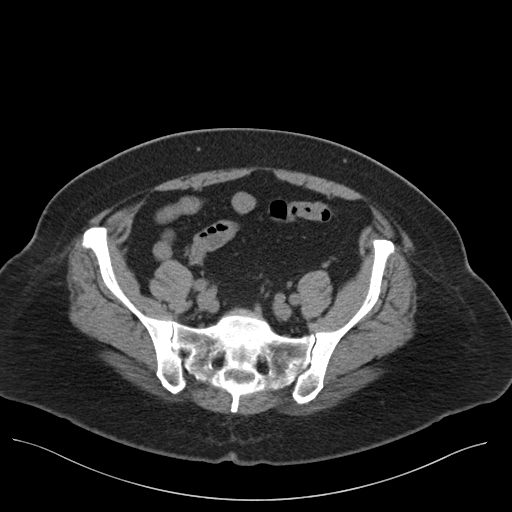
[im 44/99  soft-tissue]
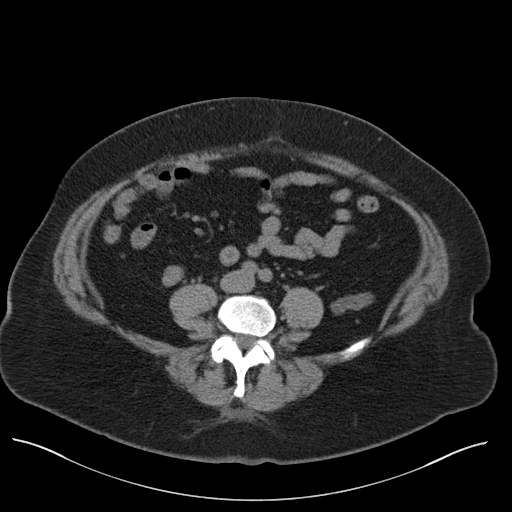
[im 50/99  soft-tissue]
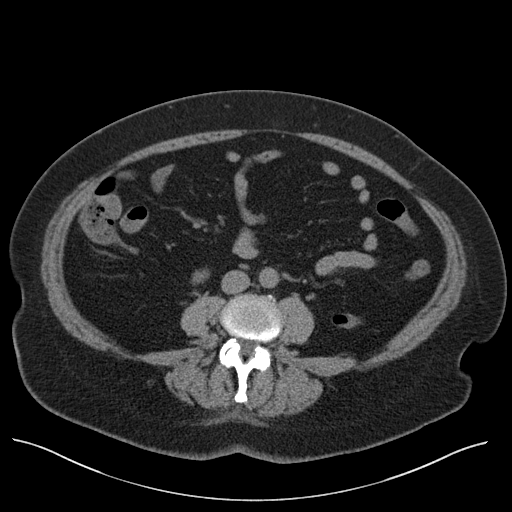
[im 55/99  soft-tissue]
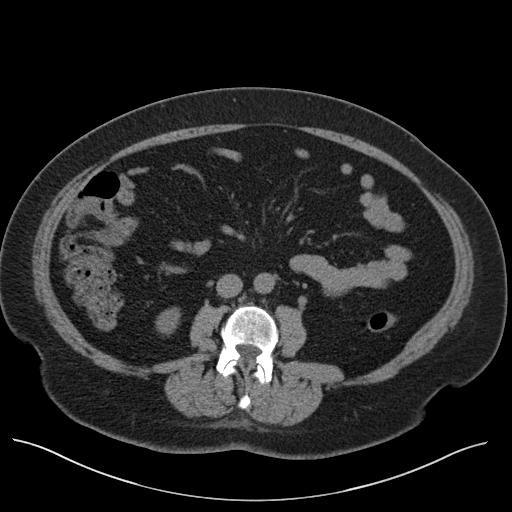
[im 66/99  soft-tissue]
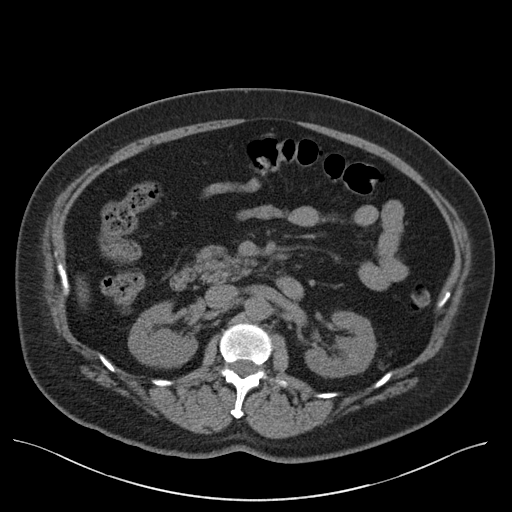
[im 66/99  bone]
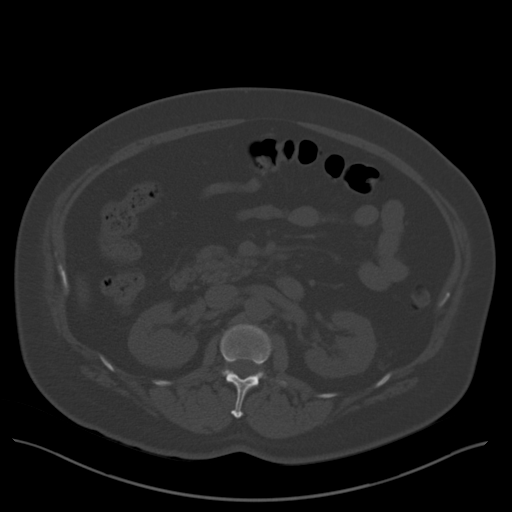
[im 71/99  soft-tissue]
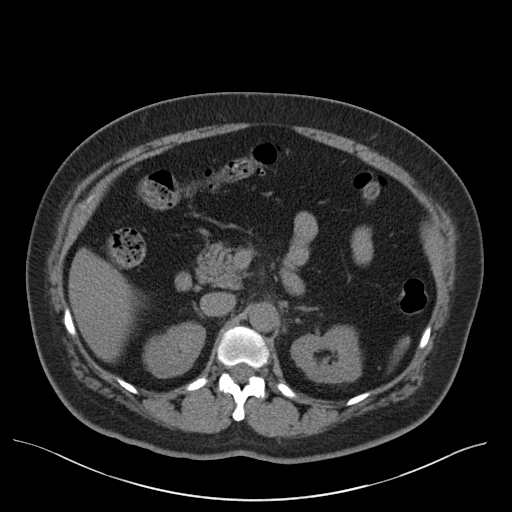
[im 77/99  soft-tissue]
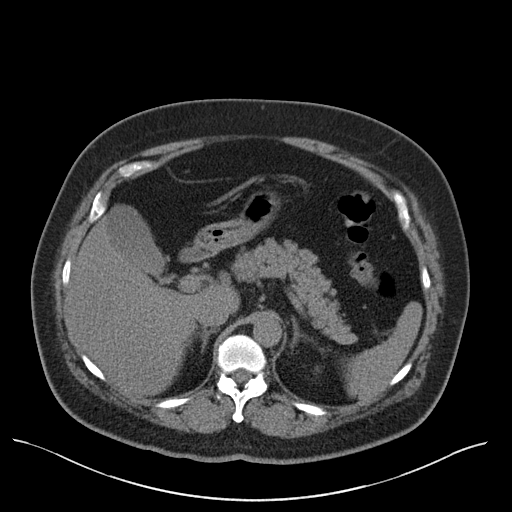
[im 88/99  soft-tissue]
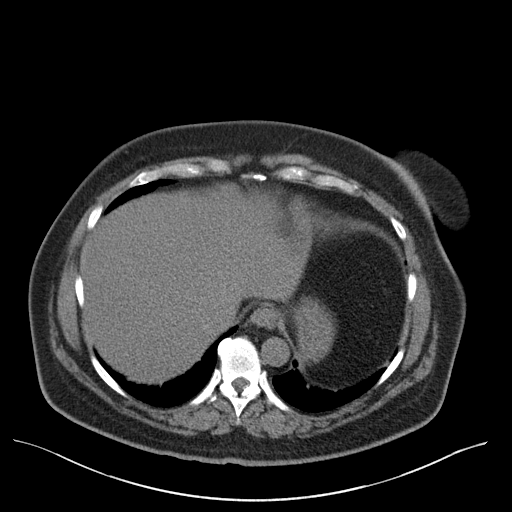
[im 93/99  soft-tissue]
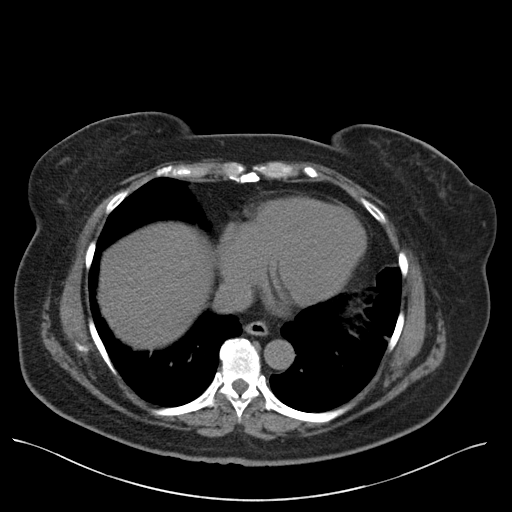

[Series 4: coronal · coronal · 0.92mm/px · 3 of 134 slices shown]
[im 45/134  soft-tissue]
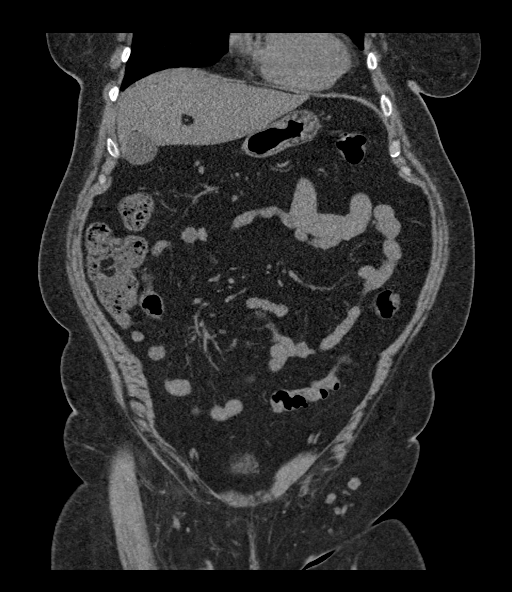
[im 60/134  soft-tissue]
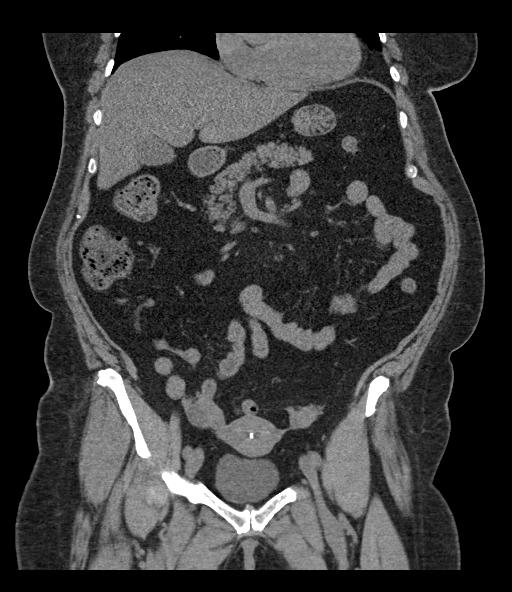
[im 74/134  soft-tissue]
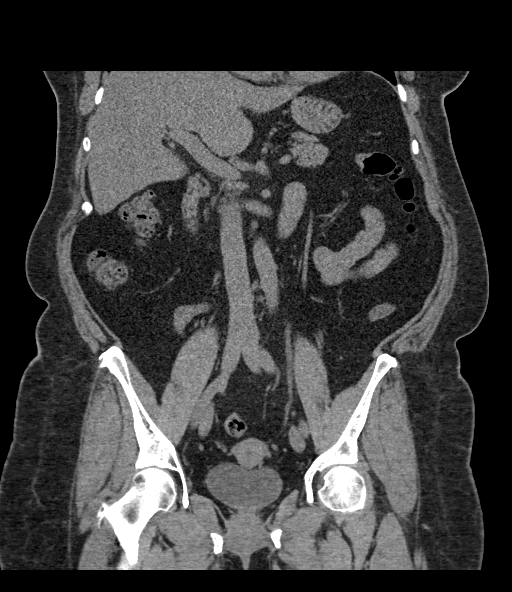

[16 of 46 positions shown; findings below may reference images not displayed]

FINDINGS: Lower chest: Mild bibasilar atelectasis is seen.

Hepatobiliary: Mild fatty infiltration of the liver is noted. The
gallbladder is within normal limits.

Pancreas: Unremarkable. No pancreatic ductal dilatation or
surrounding inflammatory changes.

Spleen: Normal in size without focal abnormality.

Adrenals/Urinary Tract: Adrenal glands are unremarkable bilaterally.
Kidneys are well visualize without renal calculi. Minimal fullness
of the left collecting system and left ureter are noted. This is
secondary to a 3-4 mm stone at the left UVJ. The bladder is
decompressed.

Stomach/Bowel: The appendix is within normal limits. No obstructive
or inflammatory changes of the colon are seen. Small bowel and
stomach are within normal limits.

Vascular/Lymphatic: No significant vascular findings are present. No
enlarged abdominal or pelvic lymph nodes.

Reproductive: An IUD is noted within the uterus. No adnexal mass is
seen.

Other: No abdominal wall hernia or abnormality. No abdominopelvic
ascites.

Musculoskeletal: No acute or significant osseous findings.
IMPRESSION: 3-4 mm left UVJ stone with obstructive change.

Fatty liver.

## 2021-10-16 DIAGNOSIS — I10 Essential (primary) hypertension: Secondary | ICD-10-CM | POA: Diagnosis not present

## 2021-10-16 DIAGNOSIS — K219 Gastro-esophageal reflux disease without esophagitis: Secondary | ICD-10-CM | POA: Diagnosis not present

## 2021-10-16 DIAGNOSIS — Z79899 Other long term (current) drug therapy: Secondary | ICD-10-CM | POA: Diagnosis not present

## 2021-10-16 DIAGNOSIS — R1310 Dysphagia, unspecified: Secondary | ICD-10-CM | POA: Diagnosis not present

## 2021-10-23 DIAGNOSIS — E7800 Pure hypercholesterolemia, unspecified: Secondary | ICD-10-CM | POA: Diagnosis not present

## 2021-12-02 DIAGNOSIS — L0109 Other impetigo: Secondary | ICD-10-CM | POA: Diagnosis not present

## 2022-01-28 DIAGNOSIS — K2930 Chronic superficial gastritis without bleeding: Secondary | ICD-10-CM | POA: Diagnosis not present

## 2022-01-28 DIAGNOSIS — K635 Polyp of colon: Secondary | ICD-10-CM | POA: Diagnosis not present

## 2022-01-28 DIAGNOSIS — R1013 Epigastric pain: Secondary | ICD-10-CM | POA: Diagnosis not present

## 2022-01-28 DIAGNOSIS — D123 Benign neoplasm of transverse colon: Secondary | ICD-10-CM | POA: Diagnosis not present

## 2022-01-28 DIAGNOSIS — K219 Gastro-esophageal reflux disease without esophagitis: Secondary | ICD-10-CM | POA: Diagnosis not present

## 2022-01-28 DIAGNOSIS — K5730 Diverticulosis of large intestine without perforation or abscess without bleeding: Secondary | ICD-10-CM | POA: Diagnosis not present

## 2022-01-28 DIAGNOSIS — K449 Diaphragmatic hernia without obstruction or gangrene: Secondary | ICD-10-CM | POA: Diagnosis not present

## 2022-01-28 DIAGNOSIS — Z1211 Encounter for screening for malignant neoplasm of colon: Secondary | ICD-10-CM | POA: Diagnosis not present

## 2022-03-03 DIAGNOSIS — M25531 Pain in right wrist: Secondary | ICD-10-CM | POA: Diagnosis not present

## 2022-05-15 DIAGNOSIS — E1165 Type 2 diabetes mellitus with hyperglycemia: Secondary | ICD-10-CM | POA: Diagnosis not present

## 2022-05-15 DIAGNOSIS — E7800 Pure hypercholesterolemia, unspecified: Secondary | ICD-10-CM | POA: Diagnosis not present

## 2022-05-15 DIAGNOSIS — R7301 Impaired fasting glucose: Secondary | ICD-10-CM | POA: Diagnosis not present

## 2022-05-23 DIAGNOSIS — E7800 Pure hypercholesterolemia, unspecified: Secondary | ICD-10-CM | POA: Diagnosis not present

## 2022-05-23 DIAGNOSIS — E669 Obesity, unspecified: Secondary | ICD-10-CM | POA: Diagnosis not present

## 2022-05-23 DIAGNOSIS — I10 Essential (primary) hypertension: Secondary | ICD-10-CM | POA: Diagnosis not present

## 2022-05-23 DIAGNOSIS — E1165 Type 2 diabetes mellitus with hyperglycemia: Secondary | ICD-10-CM | POA: Diagnosis not present

## 2022-09-16 DIAGNOSIS — Z1231 Encounter for screening mammogram for malignant neoplasm of breast: Secondary | ICD-10-CM | POA: Diagnosis not present

## 2022-09-16 DIAGNOSIS — Z124 Encounter for screening for malignant neoplasm of cervix: Secondary | ICD-10-CM | POA: Diagnosis not present

## 2022-09-16 DIAGNOSIS — Z01419 Encounter for gynecological examination (general) (routine) without abnormal findings: Secondary | ICD-10-CM | POA: Diagnosis not present

## 2022-09-22 DIAGNOSIS — E669 Obesity, unspecified: Secondary | ICD-10-CM | POA: Diagnosis not present

## 2022-09-22 DIAGNOSIS — E1165 Type 2 diabetes mellitus with hyperglycemia: Secondary | ICD-10-CM | POA: Diagnosis not present

## 2022-09-22 DIAGNOSIS — I10 Essential (primary) hypertension: Secondary | ICD-10-CM | POA: Diagnosis not present

## 2022-09-22 DIAGNOSIS — E7800 Pure hypercholesterolemia, unspecified: Secondary | ICD-10-CM | POA: Diagnosis not present

## 2022-10-17 IMAGING — DX DG CHEST 2V
2 series · 2 of 2 positions shown · non-contrast
Comparison: 08/14/2016

CLINICAL DATA: Chest pain

EXAM:
CHEST - 2 VIEW

[chest pa]
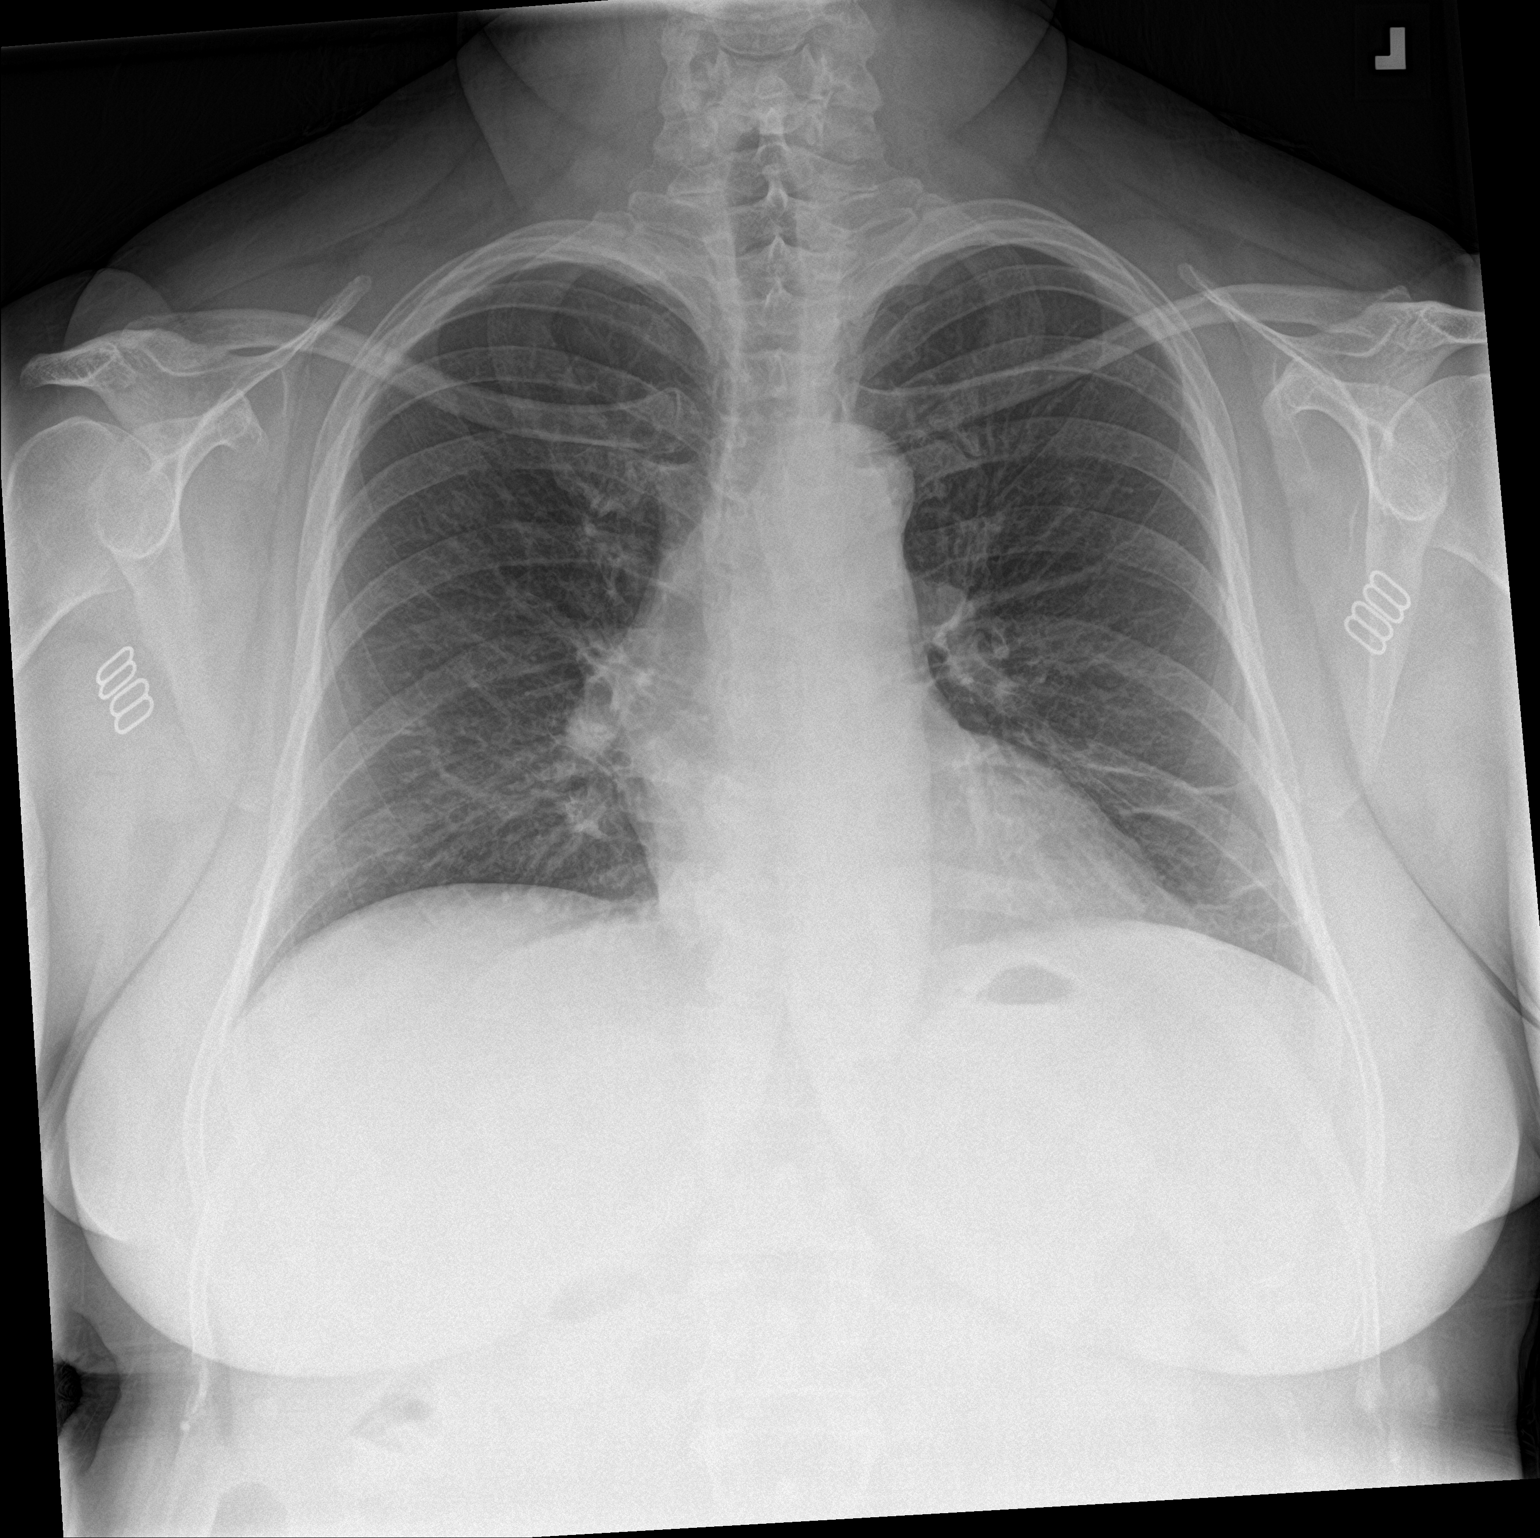

[chest lat]
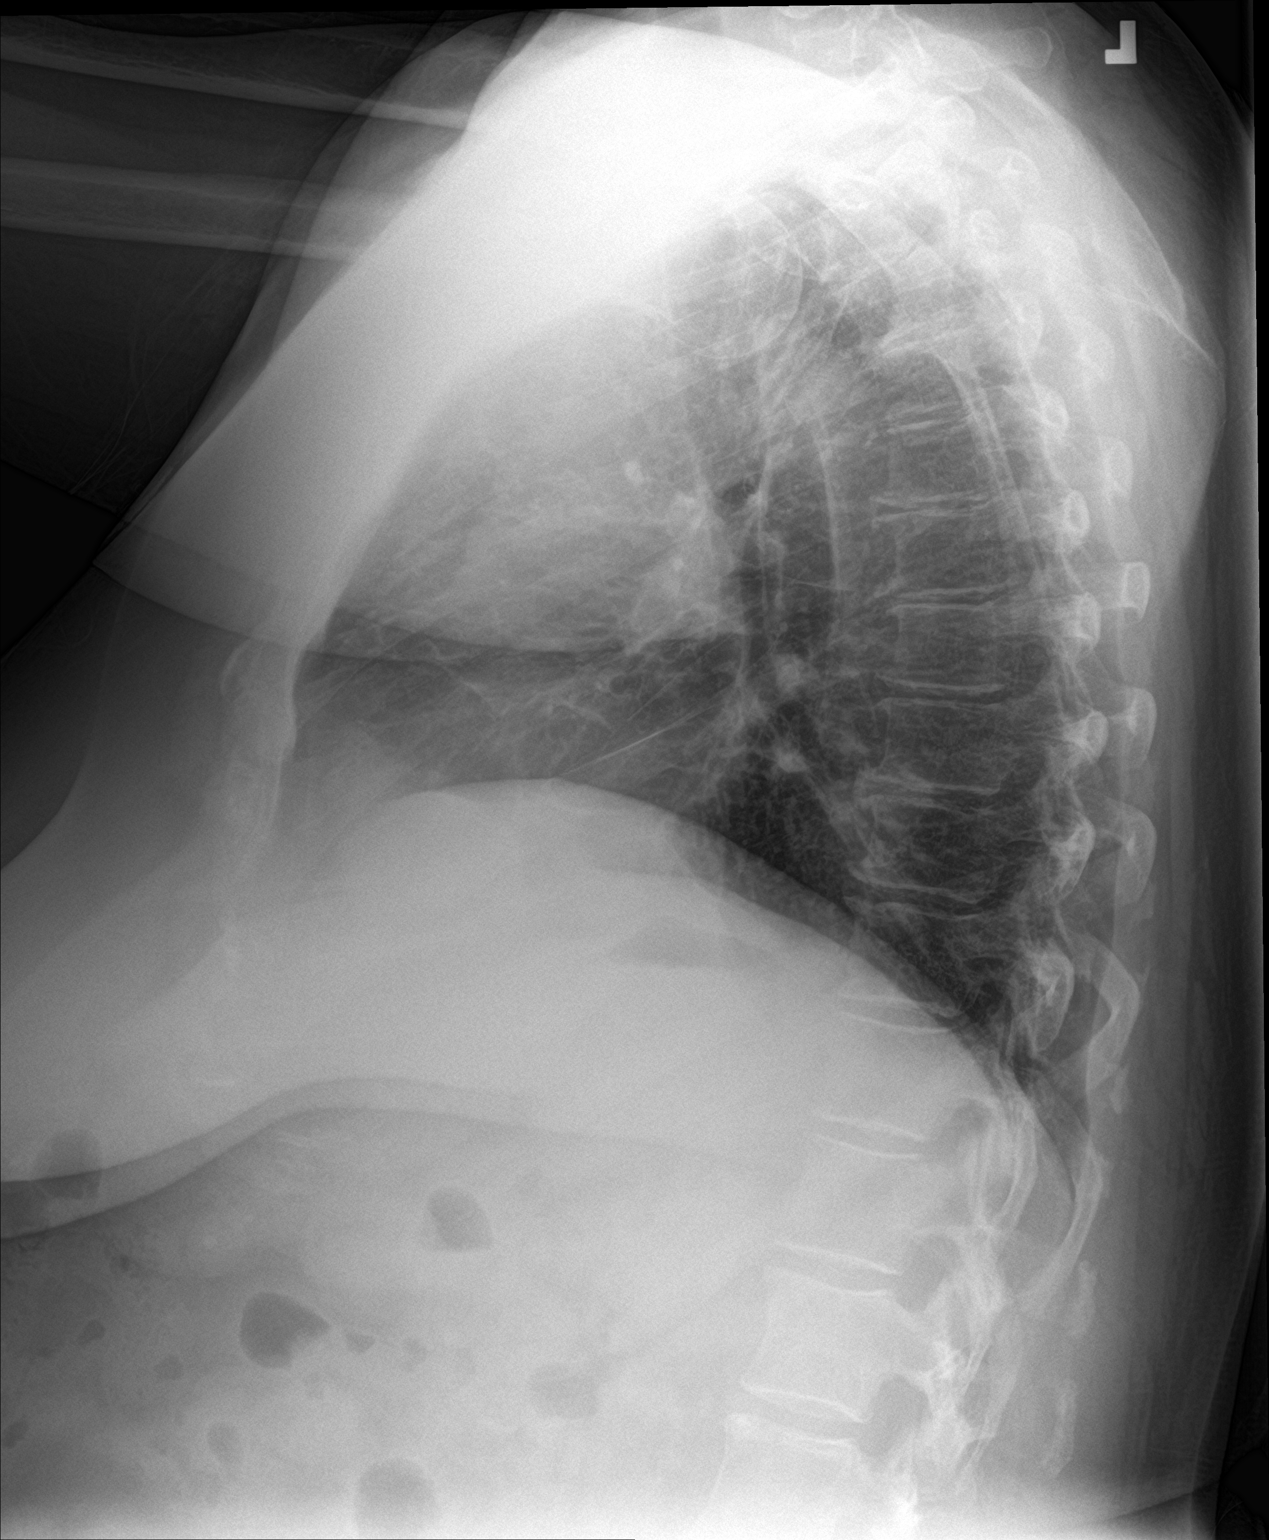

[2 of 2 positions shown; findings below may reference images not displayed]

FINDINGS: Mild linear densities in the lingula compatible with scarring,
unchanged. Lungs otherwise clear. No acute infiltrate or edema. No
pleural effusion.
IMPRESSION: No active cardiopulmonary disease.

## 2022-11-13 DIAGNOSIS — R21 Rash and other nonspecific skin eruption: Secondary | ICD-10-CM | POA: Diagnosis not present

## 2023-05-01 DIAGNOSIS — E7800 Pure hypercholesterolemia, unspecified: Secondary | ICD-10-CM | POA: Diagnosis not present

## 2023-05-01 DIAGNOSIS — E1165 Type 2 diabetes mellitus with hyperglycemia: Secondary | ICD-10-CM | POA: Diagnosis not present

## 2023-05-07 DIAGNOSIS — Z23 Encounter for immunization: Secondary | ICD-10-CM | POA: Diagnosis not present

## 2023-05-07 DIAGNOSIS — E669 Obesity, unspecified: Secondary | ICD-10-CM | POA: Diagnosis not present

## 2023-05-07 DIAGNOSIS — I10 Essential (primary) hypertension: Secondary | ICD-10-CM | POA: Diagnosis not present

## 2023-05-07 DIAGNOSIS — E1165 Type 2 diabetes mellitus with hyperglycemia: Secondary | ICD-10-CM | POA: Diagnosis not present

## 2023-05-07 DIAGNOSIS — E7800 Pure hypercholesterolemia, unspecified: Secondary | ICD-10-CM | POA: Diagnosis not present

## 2023-07-28 DIAGNOSIS — R350 Frequency of micturition: Secondary | ICD-10-CM | POA: Diagnosis not present

## 2023-07-28 DIAGNOSIS — N3000 Acute cystitis without hematuria: Secondary | ICD-10-CM | POA: Diagnosis not present

## 2023-07-28 DIAGNOSIS — R300 Dysuria: Secondary | ICD-10-CM | POA: Diagnosis not present

## 2023-09-27 DIAGNOSIS — R0789 Other chest pain: Secondary | ICD-10-CM | POA: Diagnosis not present

## 2023-09-27 DIAGNOSIS — Z5321 Procedure and treatment not carried out due to patient leaving prior to being seen by health care provider: Secondary | ICD-10-CM | POA: Diagnosis not present

## 2023-09-27 DIAGNOSIS — R071 Chest pain on breathing: Secondary | ICD-10-CM | POA: Diagnosis not present

## 2023-09-27 DIAGNOSIS — I498 Other specified cardiac arrhythmias: Secondary | ICD-10-CM | POA: Diagnosis not present

## 2023-11-18 DIAGNOSIS — E7800 Pure hypercholesterolemia, unspecified: Secondary | ICD-10-CM | POA: Diagnosis not present

## 2023-11-19 DIAGNOSIS — H53143 Visual discomfort, bilateral: Secondary | ICD-10-CM | POA: Diagnosis not present

## 2023-11-19 DIAGNOSIS — E119 Type 2 diabetes mellitus without complications: Secondary | ICD-10-CM | POA: Diagnosis not present

## 2023-11-24 DIAGNOSIS — I10 Essential (primary) hypertension: Secondary | ICD-10-CM | POA: Diagnosis not present

## 2023-11-24 DIAGNOSIS — E669 Obesity, unspecified: Secondary | ICD-10-CM | POA: Diagnosis not present

## 2023-11-24 DIAGNOSIS — E7800 Pure hypercholesterolemia, unspecified: Secondary | ICD-10-CM | POA: Diagnosis not present

## 2023-11-24 DIAGNOSIS — E1165 Type 2 diabetes mellitus with hyperglycemia: Secondary | ICD-10-CM | POA: Diagnosis not present

## 2024-03-08 DIAGNOSIS — N951 Menopausal and female climacteric states: Secondary | ICD-10-CM | POA: Diagnosis not present

## 2024-03-08 DIAGNOSIS — Z1231 Encounter for screening mammogram for malignant neoplasm of breast: Secondary | ICD-10-CM | POA: Diagnosis not present

## 2024-03-08 DIAGNOSIS — Z1331 Encounter for screening for depression: Secondary | ICD-10-CM | POA: Diagnosis not present

## 2024-03-08 DIAGNOSIS — Z01419 Encounter for gynecological examination (general) (routine) without abnormal findings: Secondary | ICD-10-CM | POA: Diagnosis not present

## 2024-03-16 DIAGNOSIS — N951 Menopausal and female climacteric states: Secondary | ICD-10-CM | POA: Diagnosis not present

## 2024-03-16 DIAGNOSIS — N952 Postmenopausal atrophic vaginitis: Secondary | ICD-10-CM | POA: Diagnosis not present

## 2024-06-02 ENCOUNTER — Other Ambulatory Visit (HOSPITAL_BASED_OUTPATIENT_CLINIC_OR_DEPARTMENT_OTHER): Payer: Self-pay | Admitting: Endocrinology

## 2024-06-02 DIAGNOSIS — E1165 Type 2 diabetes mellitus with hyperglycemia: Secondary | ICD-10-CM

## 2024-06-22 ENCOUNTER — Ambulatory Visit (HOSPITAL_BASED_OUTPATIENT_CLINIC_OR_DEPARTMENT_OTHER)
Admission: RE | Admit: 2024-06-22 | Discharge: 2024-06-22 | Disposition: A | Discharge status: Home / Self Care | Payer: Self-pay | Source: Ambulatory Visit | Attending: Endocrinology | Admitting: Endocrinology

## 2024-06-22 DIAGNOSIS — E1165 Type 2 diabetes mellitus with hyperglycemia: Secondary | ICD-10-CM | POA: Insufficient documentation
